# Patient Record
Sex: Female | Born: 1945 | Race: White | Hispanic: No | Marital: Married | Smoking: Never smoker
Health system: Southern US, Community
[De-identification: ages and names within clinical notes are randomized; demographics above are authoritative.]

## PROBLEM LIST (undated history)

## (undated) DIAGNOSIS — T753XXA Motion sickness, initial encounter: Secondary | ICD-10-CM

## (undated) DIAGNOSIS — F329 Major depressive disorder, single episode, unspecified: Secondary | ICD-10-CM

## (undated) DIAGNOSIS — Z9889 Other specified postprocedural states: Secondary | ICD-10-CM

## (undated) DIAGNOSIS — F32A Depression, unspecified: Secondary | ICD-10-CM

## (undated) DIAGNOSIS — R112 Nausea with vomiting, unspecified: Secondary | ICD-10-CM

## (undated) DIAGNOSIS — C801 Malignant (primary) neoplasm, unspecified: Secondary | ICD-10-CM

## (undated) HISTORY — PX: BREAST BIOPSY: SHX20

## (undated) HISTORY — PX: BREAST EXCISIONAL BIOPSY: SUR124

## (undated) HISTORY — PX: NO PAST SURGERIES: SHX2092

---

## 2005-08-21 ENCOUNTER — Ambulatory Visit: Payer: Self-pay | Admitting: Family Medicine

## 2006-10-17 ENCOUNTER — Ambulatory Visit: Payer: Self-pay | Admitting: Family Medicine

## 2008-02-26 ENCOUNTER — Ambulatory Visit: Payer: Self-pay | Admitting: Family Medicine

## 2009-03-09 ENCOUNTER — Ambulatory Visit: Payer: Self-pay | Admitting: Family Medicine

## 2009-03-10 ENCOUNTER — Ambulatory Visit: Payer: Self-pay | Admitting: Family Medicine

## 2011-10-27 ENCOUNTER — Ambulatory Visit: Payer: Self-pay | Admitting: Internal Medicine

## 2013-05-13 ENCOUNTER — Ambulatory Visit: Payer: Self-pay | Admitting: Family Medicine

## 2013-05-21 ENCOUNTER — Ambulatory Visit: Payer: Self-pay | Admitting: Family Medicine

## 2013-09-27 ENCOUNTER — Ambulatory Visit: Payer: Self-pay | Admitting: Physician Assistant

## 2014-01-07 ENCOUNTER — Ambulatory Visit: Payer: Self-pay | Admitting: Emergency Medicine

## 2014-01-07 LAB — URINALYSIS, COMPLETE
Bacteria: NEGATIVE
Bilirubin,UR: NEGATIVE
Glucose,UR: NEGATIVE mg/dL (ref 0–75)
Ketone: NEGATIVE
NITRITE: NEGATIVE
Ph: 8 (ref 4.5–8.0)
Protein: NEGATIVE
Specific Gravity: 1.01 (ref 1.003–1.030)

## 2014-01-07 LAB — CBC WITH DIFFERENTIAL/PLATELET
BASOS PCT: 0.7 %
Basophil #: 0 10*3/uL (ref 0.0–0.1)
EOS ABS: 1 10*3/uL — AB (ref 0.0–0.7)
Eosinophil %: 17 %
HCT: 40.8 % (ref 35.0–47.0)
HGB: 13.7 g/dL (ref 12.0–16.0)
Lymphocyte #: 1.4 10*3/uL (ref 1.0–3.6)
Lymphocyte %: 22.1 %
MCH: 31.4 pg (ref 26.0–34.0)
MCHC: 33.4 g/dL (ref 32.0–36.0)
MCV: 94 fL (ref 80–100)
MONO ABS: 0.4 x10 3/mm (ref 0.2–0.9)
MONOS PCT: 6.3 %
NEUTROS PCT: 53.9 %
Neutrophil #: 3.3 10*3/uL (ref 1.4–6.5)
Platelet: 213 10*3/uL (ref 150–440)
RBC: 4.35 10*6/uL (ref 3.80–5.20)
RDW: 12.9 % (ref 11.5–14.5)
WBC: 6.2 10*3/uL (ref 3.6–11.0)

## 2014-01-07 LAB — AMYLASE: Amylase: 73 U/L (ref 25–115)

## 2014-01-07 LAB — COMPREHENSIVE METABOLIC PANEL
ALK PHOS: 77 U/L
ANION GAP: 11 (ref 7–16)
AST: 28 U/L (ref 15–37)
Albumin: 3.7 g/dL (ref 3.4–5.0)
BUN: 19 mg/dL — ABNORMAL HIGH (ref 7–18)
Bilirubin,Total: 0.6 mg/dL (ref 0.2–1.0)
CO2: 25 mmol/L (ref 21–32)
Calcium, Total: 9.6 mg/dL (ref 8.5–10.1)
Chloride: 105 mmol/L (ref 98–107)
Creatinine: 1.12 mg/dL (ref 0.60–1.30)
GFR CALC AF AMER: 58 — AB
GFR CALC NON AF AMER: 50 — AB
Glucose: 94 mg/dL (ref 65–99)
Osmolality: 283 (ref 275–301)
POTASSIUM: 4.1 mmol/L (ref 3.5–5.1)
SGPT (ALT): 23 U/L (ref 12–78)
SODIUM: 141 mmol/L (ref 136–145)
Total Protein: 7.4 g/dL (ref 6.4–8.2)

## 2014-01-07 LAB — LIPASE, BLOOD: Lipase: 134 U/L (ref 73–393)

## 2014-01-08 ENCOUNTER — Ambulatory Visit: Payer: Self-pay | Admitting: Emergency Medicine

## 2014-01-09 LAB — URINE CULTURE

## 2015-05-10 ENCOUNTER — Other Ambulatory Visit: Payer: Self-pay | Admitting: Family Medicine

## 2015-05-10 DIAGNOSIS — Z1231 Encounter for screening mammogram for malignant neoplasm of breast: Secondary | ICD-10-CM

## 2015-11-11 ENCOUNTER — Encounter: Payer: Self-pay | Admitting: Emergency Medicine

## 2015-11-11 ENCOUNTER — Ambulatory Visit
Admission: EM | Admit: 2015-11-11 | Discharge: 2015-11-11 | Disposition: A | Discharge status: Home / Self Care | Payer: Medicare Other | Attending: Family Medicine | Admitting: Family Medicine

## 2015-11-11 DIAGNOSIS — M79644 Pain in right finger(s): Secondary | ICD-10-CM | POA: Diagnosis not present

## 2015-11-11 DIAGNOSIS — L03011 Cellulitis of right finger: Secondary | ICD-10-CM

## 2015-11-11 HISTORY — DX: Major depressive disorder, single episode, unspecified: F32.9

## 2015-11-11 HISTORY — DX: Depression, unspecified: F32.A

## 2015-11-11 MED ORDER — MUPIROCIN 2 % EX OINT: 1.0000 "application " | TOPICAL_OINTMENT | Freq: Three times a day (TID) | CUTANEOUS | Status: DC

## 2015-11-11 MED ORDER — SULFAMETHOXAZOLE-TRIMETHOPRIM 800-160 MG PO TABS: 1.0000 | ORAL_TABLET | Freq: Two times a day (BID) | ORAL | Status: DC

## 2015-11-11 NOTE — Discharge Instructions (Signed)
Fingertip Infection When an infection is around the nail, it is called a paronychia. When it appears over the tip of the finger, it is called a felon. These infections are due to minor injuries or cracks in the skin. If they are not treated properly, they can lead to bone infection and permanent damage to the fingernail. Incision and drainage is necessary if a pus pocket (an abscess) has formed. Antibiotics and pain medicine may also be needed. Keep your hand elevated for the next 2-3 days to reduce swelling and pain. If a pack was placed in the abscess, it should be removed in 1-2 days by your caregiver. Soak the finger in warm water for 20 minutes 4 times daily to help promote drainage. Keep the hands as dry as possible. Wear protective gloves with cotton liners. See your caregiver for follow-up care as recommended.  HOME CARE INSTRUCTIONS   Keep wound clean, dry and dressed as suggested by your caregiver.  Soak in warm salt water for fifteen minutes, four times per day for bacterial infections.  Your caregiver will prescribe an antibiotic if a bacterial infection is suspected. Take antibiotics as directed and finish the prescription, even if the problem appears to be improving before the medicine is gone.  Only take over-the-counter or prescription medicines for pain, discomfort, or fever as directed by your caregiver. SEEK IMMEDIATE MEDICAL CARE IF:  There is redness, swelling, or increasing pain in the wound.  Pus or any other unusual drainage is coming from the wound.  An unexplained oral temperature above 102 F (38.9 C) develops.  You notice a foul smell coming from the wound or dressing. MAKE SURE YOU:   Understand these instructions.  Monitor your condition.  Contact your caregiver if you are getting worse or not improving.   This information is not intended to replace advice given to you by your health care provider. Make sure you discuss any questions you have with your  health care provider.   Document Released: 07/26/2004 Document Revised: 09/10/2011 Document Reviewed: 12/06/2014 Elsevier Interactive Patient Education 2016 Elsevier Inc.  Cellulitis Cellulitis is an infection of the skin and the tissue under the skin. The infected area is usually red and tender. This happens most often in the arms and lower legs. HOME CARE   Take your antibiotic medicine as told. Finish the medicine even if you start to feel better.  Keep the infected arm or leg raised (elevated).  Put a warm cloth on the area up to 4 times per day.  Only take medicines as told by your doctor.  Keep all doctor visits as told. GET HELP IF:  You see red streaks on the skin coming from the infected area.  Your red area gets bigger or turns a dark color.  Your bone or joint under the infected area is painful after the skin heals.  Your infection comes back in the same area or different area.  You have a puffy (swollen) bump in the infected area.  You have new symptoms.  You have a fever. GET HELP RIGHT AWAY IF:   You feel very sleepy.  You throw up (vomit) or have watery poop (diarrhea).  You feel sick and have muscle aches and pains.   This information is not intended to replace advice given to you by your health care provider. Make sure you discuss any questions you have with your health care provider.   Document Released: 12/05/2007 Document Revised: 03/09/2015 Document Reviewed: 09/03/2011 Elsevier Interactive Patient  Education ©2016 Elsevier Inc. ° °

## 2015-11-11 NOTE — ED Provider Notes (Signed)
CSN: [id number]     Arrival date & time 11/11/15  1616 History   First MD Initiated Contact with Patient 11/11/15 1723    Nurses notes were reviewed. Chief Complaint  Patient presents with  . Thumb Pain    Patient is complaining of pain in right thumb. States that she woke this morning with the palmar surface of the distal right thumb tender to touch to palpation. She states she uses her hands a lot and last menstrual period or irritated. She does have had an infection or trouble in her fingers like this before. Everything started today.  (Consider location/radiation/quality/duration/timing/severity/associated sxs/prior Treatment) Patient is a 70 y.o. female presenting with abscess. The history is provided by the patient. No language interpreter was used.  Abscess Location:  Finger Finger abscess location:  R thumb Abscess quality: induration and redness   Chronicity:  New Relieved by:  Nothing Ineffective treatments:  None tried Risk factors: no family hx of MRSA and no hx of MRSA     Past Medical History  Diagnosis Date  . Depression    Past Surgical History  Procedure Laterality Date  . No past surgeries     History reviewed. No pertinent family history. Social History  Substance Use Topics  . Smoking status: Never Smoker   . Smokeless tobacco: None  . Alcohol Use: No   OB History    No data available     Review of Systems  All other systems reviewed and are negative.   Allergies  Review of patient's allergies indicates no known allergies.  Home Medications   Prior to Admission medications   Medication Sig Start Date End Date Taking? Authorizing Provider  buPROPion (WELLBUTRIN) 100 MG tablet Take 100 mg by mouth 2 (two) times daily.   Yes Historical Provider, MD  sertraline (ZOLOFT) 50 MG tablet Take 50 mg by mouth daily.   Yes Historical Provider, MD  mupirocin ointment (BACTROBAN) 2 % Apply 1 application topically 3 (three) times daily. 11/11/15   Frederich Cha,  MD  sulfamethoxazole-trimethoprim (BACTRIM DS,SEPTRA DS) 800-160 MG tablet Take 1 tablet by mouth 2 (two) times daily. 11/11/15   Frederich Cha, MD   Meds Ordered and Administered this Visit  Medications - No data to display  BP 115/50 mmHg  Pulse 53  Temp(Src) 97.4 F (36.3 C)  Resp 16  Ht 5\' 10"  (1.778 m)  Wt 165 lb (74.844 kg)  BMI 23.68 kg/m2  SpO2 98% No data found.   Physical Exam  Constitutional: She is oriented to person, place, and time. She appears well-developed and well-nourished.  HENT:  Head: Normocephalic.  Eyes: Pupils are equal, round, and reactive to light.  Musculoskeletal: Normal range of motion.       Right hand: She exhibits tenderness.       Hands: Redness present over the distal  palmar surface of the right thumb  Neurological: She is alert and oriented to person, place, and time.  Skin: Skin is warm. No rash noted.  Psychiatric: She has a normal mood and affect. Her behavior is normal.  Vitals reviewed.   ED Course  Procedures (including critical care time)  Labs Review Labs Reviewed - No data to display  Imaging Review No results found.   Visual Acuity Review  Right Eye Distance:   Left Eye Distance:   Bilateral Distance:    Right Eye Near:   Left Eye Near:    Bilateral Near:         MDM  1. Thumb pain, right   2. Cellulitis of thumb, right    Explained patient that if this gets worse she may need to see a hand surgeon to have this open and she does she needs to go to [facility] or [facility] for Copy to be called. Recommend soaking Epson salt. Since we think is an infection present we'll place on Septra DS 1 tablet twice a day back pain ointment 3 times a day. Hopefully this is an early infection the right thumb that were treating appropriately and is going to get better.   Note: This dictation was prepared with Dragon dictation along with smaller phrase technology. Any transcriptional errors that result from this  process are unintentional.   Frederich Cha, MD 11/11/15 1810

## 2015-11-11 NOTE — ED Notes (Signed)
Right thumb pain started today.

## 2016-05-10 ENCOUNTER — Other Ambulatory Visit: Payer: Self-pay | Admitting: Family Medicine

## 2016-05-10 DIAGNOSIS — Z1239 Encounter for other screening for malignant neoplasm of breast: Secondary | ICD-10-CM

## 2016-07-10 ENCOUNTER — Ambulatory Visit
Admission: RE | Admit: 2016-07-10 | Discharge: 2016-07-10 | Disposition: A | Discharge status: Home / Self Care | Payer: Medicare Other | Source: Ambulatory Visit | Attending: Family Medicine | Admitting: Family Medicine

## 2016-07-10 DIAGNOSIS — Z1231 Encounter for screening mammogram for malignant neoplasm of breast: Secondary | ICD-10-CM | POA: Diagnosis present

## 2016-07-10 DIAGNOSIS — Z1239 Encounter for other screening for malignant neoplasm of breast: Secondary | ICD-10-CM

## 2016-07-10 HISTORY — DX: Malignant (primary) neoplasm, unspecified: C80.1

## 2017-05-14 ENCOUNTER — Other Ambulatory Visit: Payer: Self-pay | Admitting: Family Medicine

## 2017-05-14 DIAGNOSIS — Z1231 Encounter for screening mammogram for malignant neoplasm of breast: Secondary | ICD-10-CM

## 2017-06-24 ENCOUNTER — Other Ambulatory Visit
Admission: RE | Admit: 2017-06-24 | Discharge: 2017-06-24 | Disposition: A | Discharge status: Home / Self Care | Payer: Medicare Other | Source: Ambulatory Visit | Attending: Internal Medicine | Admitting: Internal Medicine

## 2017-06-24 DIAGNOSIS — R197 Diarrhea, unspecified: Secondary | ICD-10-CM | POA: Diagnosis present

## 2017-06-24 LAB — LACTOFERRIN, FECAL, QUALITATIVE: LACTOFERRIN, FECAL, QUAL: NEGATIVE

## 2017-07-04 LAB — GIARDIA, EIA; OVA/PARASITE: Giardia Ag, Stl: NEGATIVE

## 2017-07-04 LAB — O&P RESULT

## 2019-05-11 ENCOUNTER — Other Ambulatory Visit: Payer: Self-pay | Admitting: Gerontology

## 2019-05-11 DIAGNOSIS — Z1231 Encounter for screening mammogram for malignant neoplasm of breast: Secondary | ICD-10-CM

## 2019-05-11 DIAGNOSIS — Z1239 Encounter for other screening for malignant neoplasm of breast: Secondary | ICD-10-CM

## 2019-05-11 DIAGNOSIS — Z0000 Encounter for general adult medical examination without abnormal findings: Secondary | ICD-10-CM

## 2019-05-11 DIAGNOSIS — Z7189 Other specified counseling: Secondary | ICD-10-CM

## 2019-05-12 DIAGNOSIS — R7989 Other specified abnormal findings of blood chemistry: Secondary | ICD-10-CM | POA: Insufficient documentation

## 2019-11-09 DIAGNOSIS — G250 Essential tremor: Secondary | ICD-10-CM | POA: Insufficient documentation

## 2020-05-16 ENCOUNTER — Other Ambulatory Visit: Payer: Self-pay | Admitting: Gerontology

## 2020-05-16 DIAGNOSIS — Z1231 Encounter for screening mammogram for malignant neoplasm of breast: Secondary | ICD-10-CM

## 2020-11-14 ENCOUNTER — Other Ambulatory Visit: Payer: Self-pay | Admitting: Gerontology

## 2020-11-14 DIAGNOSIS — Z1231 Encounter for screening mammogram for malignant neoplasm of breast: Secondary | ICD-10-CM

## 2021-03-10 ENCOUNTER — Other Ambulatory Visit: Payer: Self-pay

## 2021-03-10 ENCOUNTER — Emergency Department: Payer: Medicare Other

## 2021-03-10 ENCOUNTER — Encounter: Payer: Self-pay | Admitting: Emergency Medicine

## 2021-03-10 ENCOUNTER — Emergency Department
Admission: EM | Admit: 2021-03-10 | Discharge: 2021-03-10 | Disposition: A | Discharge status: Home / Self Care | Payer: Medicare Other | Attending: Emergency Medicine | Admitting: Emergency Medicine

## 2021-03-10 DIAGNOSIS — R0789 Other chest pain: Secondary | ICD-10-CM | POA: Diagnosis present

## 2021-03-10 DIAGNOSIS — Z85828 Personal history of other malignant neoplasm of skin: Secondary | ICD-10-CM | POA: Diagnosis not present

## 2021-03-10 DIAGNOSIS — R0602 Shortness of breath: Secondary | ICD-10-CM | POA: Diagnosis not present

## 2021-03-10 LAB — TROPONIN I (HIGH SENSITIVITY): Troponin I (High Sensitivity): 4 ng/L (ref <18)

## 2021-03-10 LAB — BASIC METABOLIC PANEL
Anion gap: 6 (ref 5–15)
BUN: 28 mg/dL — ABNORMAL HIGH (ref 8–23)
CO2: 29 mmol/L (ref 22–32)
Calcium: 9.7 mg/dL (ref 8.9–10.3)
Chloride: 100 mmol/L (ref 98–111)
Creatinine, Ser: 0.99 mg/dL (ref 0.44–1.00)
GFR, Estimated: 59 mL/min — ABNORMAL LOW (ref >60)
Glucose, Bld: 98 mg/dL (ref 70–99)
Potassium: 4 mmol/L (ref 3.5–5.1)
Sodium: 135 mmol/L (ref 135–145)

## 2021-03-10 LAB — CBC
HCT: 38.1 % (ref 36.0–46.0)
Hemoglobin: 13.1 g/dL (ref 12.0–15.0)
MCH: 33.6 pg (ref 26.0–34.0)
MCHC: 34.4 g/dL (ref 30.0–36.0)
MCV: 97.7 fL (ref 80.0–100.0)
Platelets: 229 10*3/uL (ref 150–400)
RBC: 3.9 MIL/uL (ref 3.87–5.11)
RDW: 13 % (ref 11.5–15.5)
WBC: 6.3 10*3/uL (ref 4.0–10.5)
nRBC: 0 % (ref 0.0–0.2)

## 2021-03-10 NOTE — ED Triage Notes (Signed)
Pt sent over from [facility]. Pt reports awoke this am with cp to her mid chest radiating under her right breast that is intermittent and sharp and achy in nature. Pt reports broke her sternum over labor day but that has healed however the pain feels the same.

## 2021-03-10 NOTE — ED Provider Notes (Signed)
[facility] Emergency Department Provider Note   ____________________________________________   Event Date/Time   First MD Initiated Contact with Patient 03/10/21 1622     (approximate)  I have reviewed the triage vital signs and the nursing notes.   HISTORY  Chief Complaint Chest Pain and Shortness of Breath    HPI Teresa Hogan is a 75 y.o. female who presents for anterior chest wall pain  LOCATION: Substernal and left chest wall DURATION: Began this morning TIMING: Improved since onset and intermittent SEVERITY: 5/10 QUALITY: Sharp, aching CONTEXT: Patient states that when she awoke this morning she began having intermittent sharp left-sided and substernal chest pain that she states is similar to when she broke her sternum approximately 4 months ago MODIFYING FACTORS: Denies any modifying factors ASSOCIATED SYMPTOMS: Right arm discomfort, mild shortness of breath   Per medical record review, patient has history of sternal fracture          Past Medical History:  Diagnosis Date   Cancer ([place])    skin ca   Depression     There are no problems to display for this patient.   Past Surgical History:  Procedure Laterality Date   BREAST BIOPSY Left    neg   NO PAST SURGERIES      Prior to Admission medications   Medication Sig Start Date End Date Taking? Authorizing Provider  buPROPion (WELLBUTRIN) 100 MG tablet Take 100 mg by mouth 2 (two) times daily.    [provider]  mupirocin ointment (BACTROBAN) 2 % Apply 1 application topically 3 (three) times daily. 11/11/15   Frederich Cha, MD  sertraline (ZOLOFT) 50 MG tablet Take 50 mg by mouth daily.    [provider]  sulfamethoxazole-trimethoprim (BACTRIM DS,SEPTRA DS) 800-160 MG tablet Take 1 tablet by mouth 2 (two) times daily. 11/11/15   Frederich Cha, MD    Allergies Patient has no known allergies.  Family History  Problem Relation Age of Onset   Breast  cancer Mother 40   Breast cancer Maternal Aunt 31   Breast cancer Maternal Grandmother        mat gm and mat great gm    Social History Social History   Tobacco Use   Smoking status: Never  Substance Use Topics   Alcohol use: No   Drug use: No    Review of Systems Constitutional: No fever/chills Eyes: No visual changes. ENT: No sore throat. Cardiovascular: Endorses chest pain. Respiratory: Endorses mild shortness of breath. Gastrointestinal: No abdominal pain.  No nausea, no vomiting.  No diarrhea. Genitourinary: Negative for dysuria. Musculoskeletal: Negative for acute arthralgias Skin: Negative for rash. Neurological: Negative for headaches, weakness/numbness/paresthesias in any extremity Psychiatric: Negative for suicidal ideation/homicidal ideation   ____________________________________________   PHYSICAL EXAM:  VITAL SIGNS: ED Triage Vitals  Enc Vitals Group     BP 03/10/21 1514 136/61     Pulse Rate 03/10/21 1514 99     Resp 03/10/21 1514 16     Temp 03/10/21 1514 98.7 F (37.1 C)     Temp Source 03/10/21 1514 Oral     SpO2 03/10/21 1514 99 %     Weight 03/10/21 1511 160 lb (72.6 kg)     Height 03/10/21 1511 '5\' 10"'$  (1.778 m)     Head Circumference --      Peak Flow --      Pain Score 03/10/21 1511 5     Pain Loc --      Pain  Edu? --      Excl. in [place]? --    Constitutional: Alert and oriented. Well appearing and in no acute distress. Eyes: Conjunctivae are normal. PERRL. Head: Atraumatic. Nose: No congestion/rhinnorhea. Mouth/Throat: Mucous membranes are moist. Neck: No stridor Cardiovascular: Grossly normal heart sounds.  Good peripheral circulation. Respiratory: Normal respiratory effort.  No retractions. Gastrointestinal: Soft and nontender. No distention. Musculoskeletal: Left chest and substernal tenderness to palpation.  No obvious deformities Neurologic:  Normal speech and language. No gross focal neurologic deficits are appreciated. Skin:   Skin is warm and dry. No rash noted. Psychiatric: Mood and affect are normal. Speech and behavior are normal.  ____________________________________________   LABS (all labs ordered are listed, but only abnormal results are displayed)  Labs Reviewed  BASIC METABOLIC PANEL - Abnormal; Notable for the following components:      Result Value   BUN 28 (*)    GFR, Estimated 59 (*)    All other components within normal limits  CBC  TROPONIN I (HIGH SENSITIVITY)  TROPONIN I (HIGH SENSITIVITY)   ____________________________________________  EKG  ED ECG REPORT I, Naaman Plummer, the attending physician, personally viewed and interpreted this ECG.  Date: 03/10/2021 EKG Time: 1503 Rate: 78 Rhythm: normal sinus rhythm QRS Axis: normal Intervals: normal ST/T Wave abnormalities: normal Narrative Interpretation: no evidence of acute ischemia  ____________________________________________  RADIOLOGY  ED MD interpretation: 2 view chest x-ray shows no evidence of acute abnormalities including no pneumonia, pneumothorax, or widened mediastinum  Official radiology report(s): DG Chest 2 View  Result Date: 03/10/2021 CLINICAL DATA:  Chest pain, history of recent sternal fracture EXAM: CHEST - 2 VIEW COMPARISON:  None. FINDINGS: The heart size and mediastinal contours are within normal limits. Both lungs are clear. The visualized skeletal structures are unremarkable. IMPRESSION: 1.  No acute abnormality of the lungs. 2. No obvious sternal fracture on chest radiographs, which are not tailored for evaluation of the sternum. Electronically Signed   By: Eddie Candle M.D.   On: 03/10/2021 15:54    ____________________________________________   PROCEDURES  Procedure(s) performed (including Critical Care):  .1-3 Lead EKG Interpretation Performed by: Naaman Plummer, MD Authorized by: Naaman Plummer, MD     Interpretation: normal     ECG rate:  98   ECG rate assessment: normal     Rhythm:  sinus rhythm     Ectopy: none     Conduction: normal     ____________________________________________   INITIAL IMPRESSION / ASSESSMENT AND PLAN / ED COURSE  As part of my medical decision making, I reviewed the following data within the electronic medical record, if available:  Nursing notes reviewed and incorporated, Labs reviewed, EKG interpreted, Old chart reviewed, Radiograph reviewed and Notes from prior ED visits reviewed and incorporated        Workup: ECG, CXR, CBC, BMP, Troponin Findings: ECG: No overt evidence of STEMI. No evidence of Brugadas sign, delta wave, epsilon wave, significantly prolonged QTc, or malignant arrhythmia HS Troponin: Negative x1 Other Labs unremarkable for emergent problems. CXR: Without PTX, PNA, or widened mediastinum Last Stress Test: Never Last Heart Catheterization: Never HEART Score: 2  Given History, Exam, and Workup I have low suspicion for ACS, Pneumothorax, Pneumonia, Pulmonary Embolus, Tamponade, Aortic Dissection or other emergent problem as a cause for this presentation.   Reassesment: Prior to discharge patients pain was controlled and they were well appearing.  Disposition:  Discharge. Strict return precautions discussed with patient with full understanding. Advised patient  to follow up promptly with primary care provider      ____________________________________________   FINAL CLINICAL IMPRESSION(S) / ED DIAGNOSES  Final diagnoses:  Chest wall pain     ED Discharge Orders     None        Note:  This document was prepared using Dragon voice recognition software and may include unintentional dictation errors.    Naaman Plummer, MD 03/10/21 [phone]

## 2021-08-29 ENCOUNTER — Telehealth: Payer: Self-pay | Admitting: Family Medicine

## 2021-08-29 NOTE — Telephone Encounter (Signed)
Santiago Glad can you schedule pt for a new patient appt.

## 2021-08-29 NOTE — Telephone Encounter (Signed)
Pt called stating she wanted to make a new patient appointment. Pt stated that a pt of Tullo Eula Listen) told her that Derrel Nip said to call the office to make appointment.

## 2021-08-29 NOTE — Telephone Encounter (Signed)
Are you aware of this 

## 2021-08-29 NOTE — Telephone Encounter (Signed)
Yes I did agree to see her

## 2021-09-15 ENCOUNTER — Encounter: Payer: Self-pay | Admitting: Internal Medicine

## 2021-09-15 ENCOUNTER — Ambulatory Visit (INDEPENDENT_AMBULATORY_CARE_PROVIDER_SITE_OTHER): Payer: Medicare Other | Admitting: Internal Medicine

## 2021-09-15 ENCOUNTER — Other Ambulatory Visit: Payer: Self-pay

## 2021-09-15 VITALS — BP 112/66 | HR 83 | Temp 98.2°F | Ht 70.0 in | Wt 154.2 lb

## 2021-09-15 DIAGNOSIS — F331 Major depressive disorder, recurrent, moderate: Secondary | ICD-10-CM | POA: Diagnosis not present

## 2021-09-15 DIAGNOSIS — K529 Noninfective gastroenteritis and colitis, unspecified: Secondary | ICD-10-CM

## 2021-09-15 DIAGNOSIS — M85851 Other specified disorders of bone density and structure, right thigh: Secondary | ICD-10-CM

## 2021-09-15 DIAGNOSIS — J302 Other seasonal allergic rhinitis: Secondary | ICD-10-CM | POA: Diagnosis not present

## 2021-09-15 DIAGNOSIS — N1831 Chronic kidney disease, stage 3a: Secondary | ICD-10-CM | POA: Diagnosis not present

## 2021-09-15 DIAGNOSIS — Z1231 Encounter for screening mammogram for malignant neoplasm of breast: Secondary | ICD-10-CM

## 2021-09-15 DIAGNOSIS — K760 Fatty (change of) liver, not elsewhere classified: Secondary | ICD-10-CM
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
## 2021-09-15 NOTE — Patient Instructions (Signed)
Welcome! ? ?Your labs will be resulted over the weekend and I will send comments  ? ?If the diarrhea does not resolve, let me know ? ?Your annual mammogram AND your 5 fear follow up   DEXA  SCAN have been ordered.  Please call [place] to call to make your appointments  .  The phone number for [place] [facility] is  [phone]   ?

## 2021-09-16 LAB — CBC WITH DIFFERENTIAL/PLATELET
Absolute Monocytes: 496 cells/uL (ref 200–950)
Basophils Absolute: 62 cells/uL (ref 0–200)
Basophils Relative: 1 %
Eosinophils Absolute: 428 cells/uL (ref 15–500)
Eosinophils Relative: 6.9 %
HCT: 40.3 % (ref 35.0–45.0)
Hemoglobin: 13.9 g/dL (ref 11.7–15.5)
Lymphs Abs: 1414 cells/uL (ref 850–3900)
MCH: 32.7 pg (ref 27.0–33.0)
MCHC: 34.5 g/dL (ref 32.0–36.0)
MCV: 94.8 fL (ref 80.0–100.0)
MPV: 9.7 fL (ref 7.5–12.5)
Monocytes Relative: 8 %
Neutro Abs: 3801 cells/uL (ref 1500–7800)
Neutrophils Relative %: 61.3 %
Platelets: 239 10*3/uL (ref 140–400)
RBC: 4.25 10*6/uL (ref 3.80–5.10)
RDW: 12.7 % (ref 11.0–15.0)
Total Lymphocyte: 22.8 %
WBC: 6.2 10*3/uL (ref 3.8–10.8)

## 2021-09-16 LAB — COMPLETE METABOLIC PANEL WITH GFR
AG Ratio: 1.6 (calc) (ref 1.0–2.5)
ALT: 13 U/L (ref 6–29)
AST: 18 U/L (ref 10–35)
Albumin: 4.2 g/dL (ref 3.6–5.1)
Alkaline phosphatase (APISO): 84 U/L (ref 37–153)
BUN/Creatinine Ratio: 26 (calc) — ABNORMAL HIGH (ref 6–22)
BUN: 27 mg/dL — ABNORMAL HIGH (ref 7–25)
CO2: 30 mmol/L (ref 20–32)
Calcium: 10.3 mg/dL (ref 8.6–10.4)
Chloride: 105 mmol/L (ref 98–110)
Creat: 1.05 mg/dL — ABNORMAL HIGH (ref 0.60–1.00)
Globulin: 2.6 g/dL (calc) (ref 1.9–3.7)
Glucose, Bld: 83 mg/dL (ref 65–99)
Potassium: 4.3 mmol/L (ref 3.5–5.3)
Sodium: 140 mmol/L (ref 135–146)
Total Bilirubin: 0.4 mg/dL (ref 0.2–1.2)
Total Protein: 6.8 g/dL (ref 6.1–8.1)
eGFR: 55 mL/min/{1.73_m2} — ABNORMAL LOW (ref >=60)

## 2021-09-16 LAB — SEDIMENTATION RATE: Sed Rate: 22 mm/h (ref 0–30)

## 2021-09-16 LAB — LIPASE: Lipase: 16 U/L (ref 7–60)

## 2021-09-16 LAB — VITAMIN B12: Vitamin B-12: 442 pg/mL (ref 200–1100)

## 2021-09-16 LAB — C-REACTIVE PROTEIN: CRP: 3.1 mg/L (ref <8.0)

## 2021-09-17 ENCOUNTER — Encounter: Payer: Self-pay | Admitting: Internal Medicine

## 2021-09-17 DIAGNOSIS — N183 Chronic kidney disease, stage 3 unspecified: Secondary | ICD-10-CM | POA: Insufficient documentation

## 2021-09-17 DIAGNOSIS — K760 Fatty (change of) liver, not elsewhere classified: Secondary | ICD-10-CM | POA: Insufficient documentation

## 2021-09-17 DIAGNOSIS — M8580 Other specified disorders of bone density and structure, unspecified site: Secondary | ICD-10-CM | POA: Insufficient documentation

## 2021-09-17 DIAGNOSIS — K529 Noninfective gastroenteritis and colitis, unspecified: Secondary | ICD-10-CM | POA: Insufficient documentation

## 2021-09-17 DIAGNOSIS — R197 Diarrhea, unspecified: Secondary | ICD-10-CM | POA: Insufficient documentation

## 2021-09-17 DIAGNOSIS — F339 Major depressive disorder, recurrent, unspecified: Secondary | ICD-10-CM | POA: Insufficient documentation

## 2021-09-17 DIAGNOSIS — J302 Other seasonal allergic rhinitis: Secondary | ICD-10-CM | POA: Insufficient documentation

## 2021-09-17 NOTE — Assessment & Plan Note (Signed)
By 2018 DEXA ,  T score -1.3  History of traumatic sternal fracture.  ?

## 2021-09-17 NOTE — Assessment & Plan Note (Signed)
Suggested by current labs and review of past labs.  Will discuss with patient .   ?

## 2021-09-17 NOTE — Assessment & Plan Note (Signed)
Currently managed with zoloft , wellbutrin and seroquel by psychiatry  ?

## 2021-09-17 NOTE — Assessment & Plan Note (Addendum)
Recurrent, suggestive of IBS based on history,  Currently normal inflammatory markers  Agree with continuing probioitcs indefinitely . ? ?Lab Results  ?Component Value Date  ? ESRSEDRATE 22 09/15/2021  ? ?Lab Results  ?Component Value Date  ? CRP 3.1 09/15/2021  ? ? ?

## 2021-09-17 NOTE — Assessment & Plan Note (Addendum)
Suggested by 2015 CT abd and pelvis and MRI done to rule out pancreatic mass  ?

## 2021-09-19 ENCOUNTER — Other Ambulatory Visit: Payer: Self-pay | Admitting: Internal Medicine

## 2021-09-19 DIAGNOSIS — N1831 Chronic kidney disease, stage 3a: Secondary | ICD-10-CM

## 2021-09-19 DIAGNOSIS — R197 Diarrhea, unspecified: Secondary | ICD-10-CM

## 2021-09-20 ENCOUNTER — Telehealth: Payer: Self-pay

## 2021-09-20 NOTE — Telephone Encounter (Signed)
-----   Message from Crecencio Mc, MD sent at 09/19/2021 11:49 AM EDT ----- ?She is referring to her diarrhea .  Please set her up a lab appt to work up the diarrhea.  She will be providing stool samples which I am orderign now  ? ?Nephrology referral In process ?

## 2021-09-20 NOTE — Telephone Encounter (Signed)
LMTCB

## 2021-09-21 ENCOUNTER — Telehealth: Payer: Self-pay | Admitting: Family Medicine

## 2021-10-02 ENCOUNTER — Other Ambulatory Visit (INDEPENDENT_AMBULATORY_CARE_PROVIDER_SITE_OTHER): Payer: Medicare Other

## 2021-10-02 DIAGNOSIS — R197 Diarrhea, unspecified: Secondary | ICD-10-CM | POA: Diagnosis not present

## 2021-10-02 NOTE — Addendum Note (Signed)
Addended by: Leeanne Rio on: 10/02/2021 03:45 PM ? ? Modules accepted: Orders ? ?

## 2021-10-03 LAB — CELIAC DISEASE COMPREHENSIVE PANEL WITH REFLEXES
(tTG) Ab, IgA: <1 U/mL
Immunoglobulin A: 304 mg/dL (ref 70–320)

## 2021-10-06 LAB — H. PYLORI ANTIGEN, STOOL: H pylori Ag, Stl: NEGATIVE

## 2021-10-10 LAB — PANCREATIC ELASTASE, FECAL: Pancreatic Elastase, Fecal: 466 ug Elast./g (ref >200)

## 2021-10-25 ENCOUNTER — Ambulatory Visit: Payer: Medicare Other | Admitting: Internal Medicine

## 2021-10-26 ENCOUNTER — Encounter: Payer: Self-pay | Admitting: Internal Medicine

## 2021-10-26 ENCOUNTER — Other Ambulatory Visit
Admission: RE | Admit: 2021-10-26 | Discharge: 2021-10-26 | Disposition: A | Discharge status: Home / Self Care | Payer: Medicare Other | Source: Ambulatory Visit | Attending: Internal Medicine | Admitting: Internal Medicine

## 2021-10-26 ENCOUNTER — Ambulatory Visit: Payer: Medicare Other | Admitting: Internal Medicine

## 2021-10-26 VITALS — BP 130/70 | HR 74 | Temp 98.0°F | Ht 70.0 in | Wt 151.8 lb

## 2021-10-26 DIAGNOSIS — K529 Noninfective gastroenteritis and colitis, unspecified: Secondary | ICD-10-CM

## 2021-10-26 DIAGNOSIS — N1831 Chronic kidney disease, stage 3a: Secondary | ICD-10-CM

## 2021-10-26 DIAGNOSIS — R197 Diarrhea, unspecified: Secondary | ICD-10-CM

## 2021-10-26 LAB — GASTROINTESTINAL PANEL BY PCR, STOOL (REPLACES STOOL CULTURE)

## 2021-10-26 LAB — MICROALBUMIN / CREATININE URINE RATIO
Creatinine,U: 197.7 mg/dL
Microalb Creat Ratio: 0.7 mg/g (ref 0.0–30.0)
Microalb, Ur: 1.3 mg/dL (ref 0.0–1.9)

## 2021-10-26 NOTE — Patient Instructions (Signed)
Please return the stool samples to [facility] (NOT TO OUR OFFICE) ? ?Once we have ruled out any sort of infection,  I will prescribe Lomotil for your diarrhea ? ?Referral to [facility] is in process  ?

## 2021-10-26 NOTE — Progress Notes (Addendum)
Subjective:  Patient ID: Teresa Hogan, female    DOB: [birth year]  Age: 76 y.o. MRN: [id number]  CC: The primary encounter diagnosis was Stage 3a chronic kidney disease ([place]). Diagnoses of Colitis and Diarrhea of presumed infectious origin were also pertinent to this visit.     HPI Teresa Hogan presents for follow up on persistent diarrhea  Chief Complaint  Patient presents with   Diarrhea   Persistent diarrhea:  last seen one month ago.  Inflammatory markers normal . Pancreatic elastase and H Pylori antigen were normal  she states that stool consistency improved for several weeks,  but for the past week has become profuse and watery, and has been having increased cramping in the lower abdomen and lower back .  She has lost 3 lbs over the past month,  a total of 9 lbs since September 2022.  CKD:  reviewed the last several years of labs showing stable Cr with GFR 103m/min.  She has developed foamy urine    Outpatient Medications Prior to Visit  Medication Sig Dispense Refill   Acetylcysteine 600 MG CAPS Take by mouth.     ascorbic acid (VITAMIN C) 500 MG tablet Take by mouth.     buPROPion (WELLBUTRIN SR) 200 MG 12 hr tablet Take by mouth.     Calcium Carb-Cholecalciferol 500-10 MG-MCG TABS Take 10 mg by mouth in the morning and at bedtime.     cetirizine (ZYRTEC) 10 MG tablet Take by mouth.     Cholecalciferol 50 MCG (2000 UT) CAPS Take 3 capsules daily for 3 months, then reduce to 1 capsule daily thereafter for Vitamin D Deficiency.     fluticasone (FLONASE) 50 MCG/ACT nasal spray Place into the nose.     glucosamine-chondroitin 500-400 MG tablet Take 1 tablet by mouth 3 (three) times daily.     melatonin 5 MG TABS Take 5 mg by mouth as needed.     Multiple Vitamin (MULTI-VITAMIN) tablet Take 1 tablet by mouth daily.     Quercetin 500 MG CAPS Take 1 capsule by mouth daily.     QUETIAPINE FUMARATE PO Take 6.2 mg by mouth daily at 2 am.     sertraline (ZOLOFT) 50 MG  tablet Take 75 mg by mouth. Take 1 and 1/2 tablet daily to equal 75 mg     No facility-administered medications prior to visit.    Review of Systems;  Patient denies headache, fevers, malaise, unintentional weight loss, skin rash, eye pain, sinus congestion and sinus pain, sore throat, dysphagia,  hemoptysis , cough, dyspnea, wheezing, chest pain, palpitations, orthopnea, edema, abdominal pain, nausea, melena, diarrhea, constipation, flank pain, dysuria, hematuria, urinary  Frequency, nocturia, numbness, tingling, seizures,  Focal weakness, Loss of consciousness,  Tremor, insomnia, depression, anxiety, and suicidal ideation.      Objective:  BP 130/70 (BP Location: Left Arm, Patient Position: Sitting, Cuff Size: Normal)   Pulse 74   Temp 98 F (36.7 C) (Oral)   Ht '5\' 10"'$  (1.778 m)   Wt 151 lb 12.8 oz (68.9 kg)   SpO2 99%   BMI 21.78 kg/m   BP Readings from Last 3 Encounters:  10/26/21 130/70  09/15/21 112/66  03/10/21 (!) 132/57    Wt Readings from Last 3 Encounters:  10/26/21 151 lb 12.8 oz (68.9 kg)  09/15/21 154 lb 3.2 oz (69.9 kg)  03/10/21 160 lb (72.6 kg)    General appearance: alert, cooperative and appears stated age Ears: normal TM's and external ear canals  both ears Throat: lips, mucosa, and tongue normal; teeth and gums normal Neck: no adenopathy, no carotid bruit, supple, symmetrical, trachea midline and thyroid not enlarged, symmetric, no tenderness/mass/nodules Back: symmetric, no curvature. ROM normal. No CVA tenderness. Lungs: clear to auscultation bilaterally Heart: regular rate and rhythm, S1, S2 normal, no murmur, click, rub or gallop Abdomen: soft, non-tender; bowel sounds normal; no masses,  no organomegaly Pulses: 2+ and symmetric Skin: Skin color, texture, turgor normal. No rashes or lesions Lymph nodes: Cervical, supraclavicular, and axillary nodes normal.  No results found for: HGBA1C  Lab Results  Component Value Date   CREATININE 1.05 (H)  09/15/2021   CREATININE 0.99 03/10/2021   CREATININE 1.12 01/07/2014    Lab Results  Component Value Date   WBC 6.2 09/15/2021   HGB 13.9 09/15/2021   HCT 40.3 09/15/2021   PLT 239 09/15/2021   GLUCOSE 83 09/15/2021   ALT 13 09/15/2021   AST 18 09/15/2021   NA 140 09/15/2021   K 4.3 09/15/2021   CL 105 09/15/2021   CREATININE 1.05 (H) 09/15/2021   BUN 27 (H) 09/15/2021   CO2 30 09/15/2021   MICROALBUR 1.3 10/26/2021    DG Chest 2 View  Result Date: 03/10/2021 CLINICAL DATA:  Chest pain, history of recent sternal fracture EXAM: CHEST - 2 VIEW COMPARISON:  None. FINDINGS: The heart size and mediastinal contours are within normal limits. Both lungs are clear. The visualized skeletal structures are unremarkable. IMPRESSION: 1.  No acute abnormality of the lungs. 2. No obvious sternal fracture on chest radiographs, which are not tailored for evaluation of the sternum. Electronically Signed   By: Eddie Candle M.D.   On: 03/10/2021 15:54    Assessment & Plan:   Problem List Items Addressed This Visit     CKD (chronic kidney disease) stage 3, GFR 30-59 ml/min (HCC) - Primary    Appears to be stable over the past several years based on review of labs from [place] [facility].  Referral to nephrology in process,  Screening for nephropathy, MM ordered today        Relevant Orders   Urine Microalbumin w/creat. ratio (Completed)   ANA w/Reflex if Positive (Completed)   Protein electrophoresis, serum (Completed)   Immunofixation interpretive, urine (Completed)   Diarrhea of presumed infectious origin    Last comprehensive workup prior to current was in 2018 with colonoscopy fecal tests for parasites,  Etc,  All of which were normal.  Given her weight loss of 10 lbs since Sept 2022  I am concerned about microscopic colitis vs community acquired c dificile colitis, as well as parasitic and viral/bacterial etiologies  .  Referral to [facility] in process.  Will repeat GI Pathogen panel and ova  and parasites.  Add lomotil once infections are ruled out        Other Visit Diagnoses     Colitis       Relevant Orders   Ambulatory referral to Gastroenterology   GI pathogen panel by PCR, stool   Ova and parasite examination       I spent a total of  34 minutes with this patient in a face to face visit on the date of this encounter reviewing  her prior labs from [place] [facility] and her GI workup in 2018 by [facility]  including colonoscopy, Her  diet and eating habits     last office visit with me in [place]     and post visit ordering of  testing and therapeutics.    Follow-up: No follow-ups on file.   Crecencio Mc, MD

## 2021-10-26 NOTE — Addendum Note (Signed)
Addended by: Neta Ehlers on: 10/26/2021 02:12 PM ? ? Modules accepted: Orders ? ?

## 2021-10-26 NOTE — Assessment & Plan Note (Signed)
Appears to be stable over the past several years based on review of labs from [place] [facility].  Referral to nephrology in process,  Screening for nephropathy, MM ordered today  ?

## 2021-10-26 NOTE — Assessment & Plan Note (Addendum)
Last comprehensive workup prior to current was in 2018 with colonoscopy fecal tests for parasites,  Etc,  All of which were normal.  Given her weight loss of 10 lbs since Sept 2022  I am concerned about microscopic colitis vs community acquired c dificile colitis, as well as parasitic and viral/bacterial etiologies  .  Referral to [facility] in process.  Will repeat GI Pathogen panel and ova and parasites.  Add lomotil once infections are ruled out

## 2021-10-27 ENCOUNTER — Other Ambulatory Visit: Payer: Medicare Other

## 2021-10-27 ENCOUNTER — Other Ambulatory Visit
Admission: RE | Admit: 2021-10-27 | Discharge: 2021-10-27 | Disposition: A | Discharge status: Home / Self Care | Payer: Medicare Other | Source: Ambulatory Visit | Attending: Internal Medicine | Admitting: Internal Medicine

## 2021-10-27 DIAGNOSIS — K529 Noninfective gastroenteritis and colitis, unspecified: Secondary | ICD-10-CM | POA: Diagnosis present

## 2021-10-27 DIAGNOSIS — N1831 Chronic kidney disease, stage 3a: Secondary | ICD-10-CM

## 2021-10-27 LAB — ANA W/REFLEX IF POSITIVE: Anti Nuclear Antibody (ANA): NEGATIVE

## 2021-10-31 LAB — PROTEIN ELECTROPHORESIS, SERUM
Albumin ELP: 4.1 g/dL (ref 3.8–4.8)
Alpha 1: 0.3 g/dL (ref 0.2–0.3)
Alpha 2: 0.7 g/dL (ref 0.5–0.9)
Beta 2: 0.4 g/dL (ref 0.2–0.5)
Beta Globulin: 0.5 g/dL (ref 0.4–0.6)
Gamma Globulin: 0.9 g/dL (ref 0.8–1.7)
Total Protein: 6.9 g/dL (ref 6.1–8.1)

## 2021-10-31 LAB — O&P RESULT

## 2021-10-31 LAB — OVA + PARASITE EXAM

## 2021-11-01 ENCOUNTER — Encounter: Payer: Self-pay | Admitting: Gastroenterology

## 2021-11-01 ENCOUNTER — Ambulatory Visit: Payer: Medicare Other | Admitting: Gastroenterology

## 2021-11-01 NOTE — Progress Notes (Deleted)
Jonathon Bellows MD, MRCP(U.K) [address]  [place]  [place], [place] [address]  Main: [phone]  Fax: [phone]   Gastroenterology Consultation  Referring Provider:     Crecencio Mc, MD Primary Care Physician:  Crecencio Mc, MD Primary Gastroenterologist:  Dr. Jonathon Bellows  Reason for Consultation:     Colitis        HPI:   ARACELIS ULREY is a 76 y.o. y/o female referred for consultation & management  by Dr. Derrel Nip, Aris Everts, MD.  ***  Past Medical History:  Diagnosis Date   Cancer [facility])    skin ca   Depression     Past Surgical History:  Procedure Laterality Date   BREAST BIOPSY Left    neg   NO PAST SURGERIES      Prior to Admission medications   Medication Sig Start Date End Date Taking? Authorizing Provider  Acetylcysteine 600 MG CAPS Take by mouth.    [provider]  ascorbic acid (VITAMIN C) 500 MG tablet Take by mouth.    [provider]  buPROPion (WELLBUTRIN SR) 200 MG 12 hr tablet Take by mouth. 09/02/19   [provider]  Calcium Carb-Cholecalciferol 500-10 MG-MCG TABS Take 10 mg by mouth in the morning and at bedtime.    [provider]  cetirizine (ZYRTEC) 10 MG tablet Take by mouth.    [provider]  Cholecalciferol 50 MCG (2000 UT) CAPS Take 3 capsules daily for 3 months, then reduce to 1 capsule daily thereafter for Vitamin D Deficiency. 05/12/19   [provider]  fluticasone (FLONASE) 50 MCG/ACT nasal spray Place into the nose.    [provider]  glucosamine-chondroitin 500-400 MG tablet Take 1 tablet by mouth 3 (three) times daily.    [provider]  melatonin 5 MG TABS Take 5 mg by mouth as needed.    [provider]  Multiple Vitamin (MULTI-VITAMIN) tablet Take 1 tablet by mouth daily.    [provider]  Quercetin 500 MG CAPS Take 1 capsule by mouth daily.    [provider]  QUETIAPINE FUMARATE PO Take 6.2 mg by mouth daily at 2  am.    [provider]  sertraline (ZOLOFT) 50 MG tablet Take 75 mg by mouth. Take 1 and 1/2 tablet daily to equal 75 mg    [provider]    Family History  Problem Relation Age of Onset   Breast cancer Mother 77   Breast cancer Maternal Aunt 24   Breast cancer Maternal Grandmother        mat gm and mat great gm     Social History   Tobacco Use   Smoking status: Never  Substance Use Topics   Alcohol use: No   Drug use: No    Allergies as of 11/01/2021 - Review Complete 10/26/2021  Allergen Reaction Noted   Covid-19 (adenovirus) vaccine Cough 09/15/2021    Review of Systems:    All systems reviewed and negative except where noted in HPI.   Physical Exam:  There were no vitals taken for this visit. No LMP recorded. Patient is postmenopausal. Psych:  Alert and cooperative. Normal mood and affect. General:   Alert,  Well-developed, well-nourished, pleasant and cooperative in NAD Head:  Normocephalic and atraumatic. Eyes:  Sclera clear, no icterus.   Conjunctiva pink. Ears:  Normal auditory acuity. Neck:  Supple; no masses or thyromegaly. Lungs:  Respirations even and unlabored.  Clear  throughout to auscultation.   No wheezes, crackles, or rhonchi. No acute distress. Heart:  Regular rate and rhythm; no murmurs, clicks, rubs, or gallops. Abdomen:  Normal bowel sounds.  No bruits.  Soft, non-tender and non-distended without masses, hepatosplenomegaly or hernias noted.  No guarding or rebound tenderness.    Neurologic:  Alert and oriented x3;  grossly normal neurologically. Psych:  Alert and cooperative. Normal mood and affect.  Imaging Studies: No results found.  Assessment and Plan:   LIZZETT NOBILE is a 76 y.o. y/o female has been referred for ***  Follow up in ***  Dr Jonathon Bellows MD,MRCP(U.K)

## 2021-11-03 LAB — IMMUNOFIXATION INTE

## 2021-11-09 ENCOUNTER — Other Ambulatory Visit: Payer: Self-pay | Admitting: Nephrology

## 2021-11-09 DIAGNOSIS — N1831 Chronic kidney disease, stage 3a: Secondary | ICD-10-CM

## 2021-11-14 ENCOUNTER — Ambulatory Visit
Admission: RE | Admit: 2021-11-14 | Discharge: 2021-11-14 | Disposition: A | Discharge status: Home / Self Care | Payer: Medicare Other | Source: Ambulatory Visit | Attending: Nephrology | Admitting: Nephrology

## 2021-11-14 DIAGNOSIS — N1831 Chronic kidney disease, stage 3a: Secondary | ICD-10-CM | POA: Diagnosis present

## 2021-11-19 DIAGNOSIS — D472 Monoclonal gammopathy: Secondary | ICD-10-CM | POA: Insufficient documentation

## 2021-11-19 NOTE — Progress Notes (Signed)
[facility]  [phone] [phone]  ID: Teresa Hogan OB: 07-18-1945  MR#: [id number]  YPP#:[id number]  [name], MD as PCP - General (Internal Medicine)  CHIEF COMPLAINT: MGUS  INTERVAL HISTORY: Patient is a 76 year old female who was noted to have a positive M spike in the urine on work-up for chronic renal insufficiency.  She is referred for further evaluation.  She currently feels well and is asymptomatic.  She has no neurologic complaints.  She denies any recent fevers or illnesses.  She has a good appetite and denies weight loss.  She has no chest pain, shortness of breath, cough, or hemoptysis.  She denies any nausea, vomiting, constipation, or diarrhea.  She has no urinary complaints.  Patient feels at her baseline and offers no specific complaints today.  REVIEW OF SYSTEMS:   Review of Systems  Constitutional: Negative.  Negative for fever, malaise/fatigue and weight loss.  Respiratory: Negative.  Negative for cough, hemoptysis and shortness of breath.   Cardiovascular: Negative.  Negative for chest pain and leg swelling.  Gastrointestinal: Negative.  Negative for abdominal pain.  Genitourinary: Negative.  Negative for dysuria.  Musculoskeletal: Negative.  Negative for back pain.  Skin: Negative.  Negative for rash.  Neurological: Negative.  Negative for dizziness, focal weakness, weakness and headaches.  Psychiatric/Behavioral: Negative.  The patient is not nervous/anxious.    As per HPI. Otherwise, a complete review of systems is negative.  PAST MEDICAL HISTORY: Past Medical History:  Diagnosis Date   Cancer ([place])    skin ca   Depression     PAST SURGICAL HISTORY: Past Surgical History:  Procedure Laterality Date   BREAST BIOPSY Left    neg   NO PAST SURGERIES      FAMILY HISTORY: Family History  Problem Relation Age of Onset   Breast cancer Mother 88   Non-Hodgkin's lymphoma Father     Prostate cancer Brother    Breast cancer Maternal Aunt 47   Breast cancer Maternal Grandmother        mat gm and mat great gm    ADVANCED DIRECTIVES (Y/N):  N  HEALTH MAINTENANCE: Social History   Tobacco Use   Smoking status: Never  Vaping Use   Vaping Use: Never used  Substance Use Topics   Alcohol use: Yes    Alcohol/week: 1.0 standard drink    Types: 1 Cans of beer per week    Comment: 1 craft beer daily   Drug use: No     Colonoscopy:  PAP:  Bone density:  Lipid panel:  Allergies  Allergen Reactions   Covid-19 (Adenovirus) Vaccine Cough    Pfizer vaccine  and coughs    Current Outpatient Medications  Medication Sig Dispense Refill   Acetylcysteine 600 MG CAPS Take by mouth.     ascorbic acid (VITAMIN C) 500 MG tablet Take by mouth.     buPROPion (WELLBUTRIN SR) 200 MG 12 hr tablet Take by mouth.     Calcium Carb-Cholecalciferol 500-10 MG-MCG TABS Take 10 mg by mouth in the morning and at bedtime.     cetirizine (ZYRTEC) 10 MG tablet Take by mouth.     Cholecalciferol 50 MCG (2000 UT) CAPS Take 3 capsules daily for 3 months, then reduce to 1 capsule daily thereafter for Vitamin D Deficiency.     fluticasone (FLONASE) 50 MCG/ACT nasal spray Place into the nose.     glucosamine-chondroitin 500-400 MG tablet Take 1 tablet by mouth  3 (three) times daily.     loperamide (IMODIUM) 2 MG capsule Take by mouth.     melatonin 5 MG TABS Take 5 mg by mouth as needed.     Multiple Vitamin (MULTI-VITAMIN) tablet Take 1 tablet by mouth daily.     Quercetin 500 MG CAPS Take 1 capsule by mouth daily.     QUETIAPINE FUMARATE PO Take 6.2 mg by mouth daily at 2 am.     sertraline (ZOLOFT) 50 MG tablet Take 75 mg by mouth. Take 1 and 1/2 tablet daily to equal 75 mg     No current facility-administered medications for this visit.    OBJECTIVE: Vitals:   11/22/21 1135  BP: 104/73  Pulse: 84  Resp: 16  Temp: (!) 96.2 F (35.7 C)  SpO2: 100%     Body mass index is 21.81  kg/m.    ECOG FS:0 - Asymptomatic  General: Well-developed, well-nourished, no acute distress. Eyes: Pink conjunctiva, anicteric sclera. HEENT: Normocephalic, moist mucous membranes. Lungs: No audible wheezing or coughing. Heart: Regular rate and rhythm. Abdomen: Soft, nontender, no obvious distention. Musculoskeletal: No edema, cyanosis, or clubbing. Neuro: Alert, answering all questions appropriately. Cranial nerves grossly intact. Skin: No rashes or petechiae noted. Psych: Normal affect. Lymphatics: No cervical, calvicular, axillary or inguinal LAD.   LAB RESULTS:  Lab Results  Component Value Date   NA 140 09/15/2021   K 4.3 09/15/2021   CL 105 09/15/2021   CO2 30 09/15/2021   GLUCOSE 83 09/15/2021   BUN 27 (H) 09/15/2021   CREATININE 1.05 (H) 09/15/2021   CALCIUM 10.3 09/15/2021   PROT 6.9 10/26/2021   ALBUMIN 3.7 01/07/2014   AST 18 09/15/2021   ALT 13 09/15/2021   ALKPHOS 77 01/07/2014   BILITOT 0.4 09/15/2021   GFRNONAA 59 (L) 03/10/2021   GFRAA 58 (L) 01/07/2014    Lab Results  Component Value Date   WBC 6.2 09/15/2021   NEUTROABS 3,801 09/15/2021   HGB 13.9 09/15/2021   HCT 40.3 09/15/2021   MCV 94.8 09/15/2021   PLT 239 09/15/2021     STUDIES: US RENAL  Result Date: 11/15/2021 CLINICAL DATA:  Chronic renal disease EXAM: RENAL / URINARY TRACT ULTRASOUND COMPLETE COMPARISON:  None Available. FINDINGS: Right Kidney: Renal measurements: 10.1 x 4.6 x 4.8 cm = volume: 118 mL. Echogenicity within normal limits. No mass or hydronephrosis visualized. Left Kidney: Renal measurements: 10.2 x 4.8 x 4.2 cm = volume: 107 mL. Contains a 1.7 cm simple cyst in the medial upper pole of no clinical significance. No follow-up imaging recommended. Bladder: Appears normal for degree of bladder distention. Other: None. IMPRESSION: No cause for chronic renal disease identified. No significant abnormalities. Electronically Signed   By: Dorise Bullion III M.D.   On: 11/15/2021  16:13    ASSESSMENT: MGUS  PLAN:    MGUS: Previously, patient's SPEP was negative.  Repeat SPEP and UPEP are pending at time of dictation.  Immunoglobulins are within normal limits, but patient noted to have a mildly elevated kappa free light chain of 38.5.  She has no evidence of endorgan damage.  She does not require imaging or bone marrow biopsy at this time.  No intervention is needed.  Patient will have video-assisted telemedicine visit in approximately 3 weeks for further evaluation and discussion of her results. Chronic renal insufficiency: Continue monitoring and treatment per nephrology.  I spent a total of 45 minutes reviewing chart data, face-to-face evaluation with the patient, counseling and coordination of  care as detailed above.   Patient expressed understanding and was in agreement with this plan. She also understands that She can call clinic at any time with any questions, concerns, or complaints.    Lloyd Huger, MD   11/24/2021 7:08 AM

## 2021-11-22 ENCOUNTER — Encounter (INDEPENDENT_AMBULATORY_CARE_PROVIDER_SITE_OTHER): Payer: Self-pay

## 2021-11-22 ENCOUNTER — Inpatient Hospital Stay: Payer: Medicare Other

## 2021-11-22 ENCOUNTER — Inpatient Hospital Stay: Payer: Medicare Other | Attending: Oncology | Admitting: Oncology

## 2021-11-22 ENCOUNTER — Encounter: Payer: Self-pay | Admitting: Oncology

## 2021-11-22 DIAGNOSIS — N189 Chronic kidney disease, unspecified: Secondary | ICD-10-CM | POA: Diagnosis not present

## 2021-11-22 DIAGNOSIS — Z8042 Family history of malignant neoplasm of prostate: Secondary | ICD-10-CM | POA: Diagnosis not present

## 2021-11-22 DIAGNOSIS — Z807 Family history of other malignant neoplasms of lymphoid, hematopoietic and related tissues: Secondary | ICD-10-CM | POA: Diagnosis not present

## 2021-11-22 DIAGNOSIS — Z79899 Other long term (current) drug therapy: Secondary | ICD-10-CM | POA: Diagnosis not present

## 2021-11-22 DIAGNOSIS — D472 Monoclonal gammopathy: Secondary | ICD-10-CM | POA: Diagnosis present

## 2021-11-22 DIAGNOSIS — Z803 Family history of malignant neoplasm of breast: Secondary | ICD-10-CM | POA: Insufficient documentation

## 2021-11-22 NOTE — Progress Notes (Signed)
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

## 2021-11-23 LAB — IGG, IGA, IGM
IgA: 310 mg/dL (ref 64–422)
IgG (Immunoglobin G), Serum: 922 mg/dL (ref 586–1602)
IgM (Immunoglobulin M), Srm: 154 mg/dL (ref 26–217)

## 2021-11-23 LAB — KAPPA/LAMBDA LIGHT CHAINS
Kappa free light chain: 38.5 mg/L — ABNORMAL HIGH (ref 3.3–19.4)
Kappa, lambda light chain ratio: 1.81 — ABNORMAL HIGH (ref 0.26–1.65)
Lambda free light chains: 21.3 mg/L (ref 5.7–26.3)

## 2021-11-24 LAB — PROTEIN ELECTROPHORESIS, SERUM
A/G Ratio: 1.4 (ref 0.7–1.7)
Albumin ELP: 4 g/dL (ref 2.9–4.4)
Alpha-1-Globulin: 0.2 g/dL (ref 0.0–0.4)
Alpha-2-Globulin: 0.7 g/dL (ref 0.4–1.0)
Beta Globulin: 1.1 g/dL (ref 0.7–1.3)
Gamma Globulin: 0.9 g/dL (ref 0.4–1.8)
Globulin, Total: 2.9 g/dL (ref 2.2–3.9)
Total Protein ELP: 6.9 g/dL (ref 6.0–8.5)

## 2021-11-24 LAB — PROTEIN ELECTRO, RANDOM URINE
Albumin ELP, Urine: 100 %
Alpha-1-Globulin, U: 0 %
Alpha-2-Globulin, U: 0 %
Beta Globulin, U: 0 %
Gamma Globulin, U: 0 %
Total Protein, Urine: 10.4 mg/dL

## 2021-12-12 ENCOUNTER — Encounter: Payer: Self-pay | Admitting: Oncology

## 2021-12-12 ENCOUNTER — Inpatient Hospital Stay: Payer: Medicare Other | Attending: Oncology | Admitting: Oncology

## 2021-12-12 DIAGNOSIS — R778 Other specified abnormalities of plasma proteins: Secondary | ICD-10-CM

## 2021-12-12 NOTE — Progress Notes (Signed)
[facility]  [phone] [phone]) [phone]  ID: Teresa Hogan OB: [birth year]  MR#: [id number]  HCW#:[id number]  [name], MD as PCP - General (Internal Medicine)  I connected with Marcelle Overlie on 12/12/21 at  3:30 PM EDT by video enabled telemedicine visit and verified that I am speaking with the correct person using two identifiers.   I discussed the limitations, risks, security and privacy concerns of performing an evaluation and management service by telemedicine and the availability of in-person appointments. I also discussed with the patient that there may be a patient responsible charge related to this service. The patient expressed understanding and agreed to proceed.   Other persons participating in the visit and their role in the encounter: Patient, MD.  Patient's location: Home. Provider's location: Clinic.  CHIEF COMPLAINT: Elevated kappa chains.  INTERVAL HISTORY: Patient agreed to video assisted telemedicine visit for further evaluation and discussion of her laboratory results.  She continues to feel well and remains asymptomatic. She has no neurologic complaints.  She denies any recent fevers or illnesses.  She has a good appetite and denies weight loss.  She has no chest pain, shortness of breath, cough, or hemoptysis.  She denies any nausea, vomiting, constipation, or diarrhea.  She has no urinary complaints.  Patient offers no specific complaints today.  REVIEW OF SYSTEMS:   Review of Systems  Constitutional: Negative.  Negative for fever, malaise/fatigue and weight loss.  Respiratory: Negative.  Negative for cough, hemoptysis and shortness of breath.   Cardiovascular: Negative.  Negative for chest pain and leg swelling.  Gastrointestinal: Negative.  Negative for abdominal pain.  Genitourinary: Negative.  Negative for dysuria.  Musculoskeletal: Negative.  Negative for back pain.  Skin: Negative.   Negative for rash.  Neurological: Negative.  Negative for dizziness, focal weakness, weakness and headaches.  Psychiatric/Behavioral: Negative.  The patient is not nervous/anxious.     As per HPI. Otherwise, a complete review of systems is negative.  PAST MEDICAL HISTORY: Past Medical History:  Diagnosis Date   Cancer ([place])    skin ca   Depression     PAST SURGICAL HISTORY: Past Surgical History:  Procedure Laterality Date   BREAST BIOPSY Left    neg   NO PAST SURGERIES      FAMILY HISTORY: Family History  Problem Relation Age of Onset   Breast cancer Mother 24   Non-Hodgkin's lymphoma Father    Prostate cancer Brother    Breast cancer Maternal Aunt 54   Breast cancer Maternal Grandmother        mat gm and mat great gm    ADVANCED DIRECTIVES (Y/N):  N  HEALTH MAINTENANCE: Social History   Tobacco Use   Smoking status: Never  Vaping Use   Vaping Use: Never used  Substance Use Topics   Alcohol use: Yes    Alcohol/week: 1.0 standard drink of alcohol    Types: 1 Cans of beer per week    Comment: 1 craft beer daily   Drug use: No     Colonoscopy:  PAP:  Bone density:  Lipid panel:  Allergies  Allergen Reactions   Covid-19 (Adenovirus) Vaccine Cough    Pfizer vaccine  and coughs    Current Outpatient Medications  Medication Sig Dispense Refill   Acetylcysteine 600 MG CAPS Take by mouth.     ascorbic acid (VITAMIN C) 500 MG tablet Take by mouth.     buPROPion (WELLBUTRIN SR) 200 MG  12 hr tablet Take by mouth.     Calcium Carb-Cholecalciferol 500-10 MG-MCG TABS Take 10 mg by mouth in the morning and at bedtime.     cetirizine (ZYRTEC) 10 MG tablet Take by mouth.     Cholecalciferol 50 MCG (2000 UT) CAPS Take 3 capsules daily for 3 months, then reduce to 1 capsule daily thereafter for Vitamin D Deficiency.     fluticasone (FLONASE) 50 MCG/ACT nasal spray Place into the nose.     glucosamine-chondroitin 500-400 MG tablet Take 1 tablet by mouth 3 (three)  times daily.     loperamide (IMODIUM) 2 MG capsule Take by mouth.     melatonin 5 MG TABS Take 5 mg by mouth as needed.     Multiple Vitamin (MULTI-VITAMIN) tablet Take 1 tablet by mouth daily.     Probiotic Product (PROBIOTIC-10 PO) Take by mouth.     Quercetin 500 MG CAPS Take 1 capsule by mouth daily.     QUETIAPINE FUMARATE PO Take 6.2 mg by mouth daily at 2 am.     sertraline (ZOLOFT) 50 MG tablet Take 75 mg by mouth. Take 1 and 1/2 tablet daily to equal 75 mg     No current facility-administered medications for this visit.    OBJECTIVE: There were no vitals filed for this visit.    There is no height or weight on file to calculate BMI.    ECOG FS:0 - Asymptomatic  General: Well-developed, well-nourished, no acute distress. HEENT: Normocephalic. Neuro: Alert, answering all questions appropriately. Cranial nerves grossly intact. Psych: Normal affect.  LAB RESULTS:  Lab Results  Component Value Date   NA 140 09/15/2021   K 4.3 09/15/2021   CL 105 09/15/2021   CO2 30 09/15/2021   GLUCOSE 83 09/15/2021   BUN 27 (H) 09/15/2021   CREATININE 1.05 (H) 09/15/2021   CALCIUM 10.3 09/15/2021   PROT 6.9 10/26/2021   ALBUMIN 3.7 01/07/2014   AST 18 09/15/2021   ALT 13 09/15/2021   ALKPHOS 77 01/07/2014   BILITOT 0.4 09/15/2021   GFRNONAA 59 (L) 03/10/2021   GFRAA 58 (L) 01/07/2014    Lab Results  Component Value Date   WBC 6.2 09/15/2021   NEUTROABS 3,801 09/15/2021   HGB 13.9 09/15/2021   HCT 40.3 09/15/2021   MCV 94.8 09/15/2021   PLT 239 09/15/2021     STUDIES: US RENAL  Result Date: 11/15/2021 CLINICAL DATA:  Chronic renal disease EXAM: RENAL / URINARY TRACT ULTRASOUND COMPLETE COMPARISON:  None Available. FINDINGS: Right Kidney: Renal measurements: 10.1 x 4.6 x 4.8 cm = volume: 118 mL. Echogenicity within normal limits. No mass or hydronephrosis visualized. Left Kidney: Renal measurements: 10.2 x 4.8 x 4.2 cm = volume: 107 mL. Contains a 1.7 cm simple cyst in the  medial upper pole of no clinical significance. No follow-up imaging recommended. Bladder: Appears normal for degree of bladder distention. Other: None. IMPRESSION: No cause for chronic renal disease identified. No significant abnormalities. Electronically Signed   By: Dorise Bullion III M.D.   On: 11/15/2021 16:13    ASSESSMENT: Elevated kappa chains.  PLAN:    Elevated kappa chains: SPEP, UPEP, and immunoglobulins all either negative or within normal limits.  Patient has a mildly elevated kappa free light chain of 38.5.  She has mild chronic renal insufficiency, but no other evidence of endorgan damage.  No intervention is needed at this time.  She does not require imaging or bone marrow biopsy at this time.  Return  to clinic in 6 months with repeat laboratory work and video assisted telemedicine visit.   Chronic renal insufficiency: Continue monitoring and treatment per nephrology.   I provided 20 minutes of face-to-face video visit time during this encounter which included chart review, counseling, and coordination of care as documented above.   Patient expressed understanding and was in agreement with this plan. She also understands that She can call clinic at any time with any questions, concerns, or complaints.    Lloyd Huger, MD   12/12/2021 3:55 PM

## 2021-12-19 DIAGNOSIS — N182 Chronic kidney disease, stage 2 (mild): Secondary | ICD-10-CM | POA: Diagnosis not present

## 2021-12-19 DIAGNOSIS — D472 Monoclonal gammopathy: Secondary | ICD-10-CM | POA: Diagnosis not present

## 2021-12-26 ENCOUNTER — Ambulatory Visit (INDEPENDENT_AMBULATORY_CARE_PROVIDER_SITE_OTHER): Payer: Medicare Other

## 2021-12-26 VITALS — Ht 70.0 in | Wt 152.0 lb

## 2021-12-26 DIAGNOSIS — Z0000 Encounter for general adult medical examination without abnormal findings: Secondary | ICD-10-CM

## 2021-12-26 NOTE — Patient Instructions (Addendum)
  Teresa Hogan , Thank you for taking time to come for your Medicare Wellness Visit. I appreciate your ongoing commitment to your health goals. Please review the following plan we discussed and let me know if I can assist you in the future.   These are the goals we discussed:  Goals      Maintain healthy lifestyle     Stay active Healthy diet Stay hydrated        This is a list of the screening recommended for you and due dates:  Health Maintenance  Topic Date Due   COVID-19 Vaccine (4 - Booster for Pfizer series) 01/11/2022*   DEXA scan (bone density measurement)  12/27/2022*   Flu Shot  01/30/2022   Zoster (Shingles) Vaccine  Completed   HPV Vaccine  Aged Out   Pneumonia Vaccine  Discontinued   Tetanus Vaccine  Discontinued   Hepatitis C Screening: USPSTF Recommendation to screen - Ages 62-79 yo.  Discontinued  *Topic was postponed. The date shown is not the original due date.

## 2021-12-26 NOTE — Progress Notes (Addendum)
Subjective:   Teresa Hogan is a 76 y.o. female who presents for an Initial Medicare Annual Wellness Visit.  Review of Systems    No ROS.  Medicare Wellness Virtual Visit.  Visual/audio telehealth visit, UTA vital signs.   See social history for additional risk factors.   Cardiac Risk Factors include: advanced age (>37mn, >>26women)     Objective:    Today's Vitals   12/26/21 1112  Weight: 152 lb (68.9 kg)  Height: '5\' 10"'$  (1.778 m)   Body mass index is 21.81 kg/m.     12/26/2021   11:14 AM 12/12/2021    2:23 PM 11/22/2021   11:12 AM  Advanced Directives  Does Patient Have a Medical Advance Directive? Yes Yes Yes  Type of AParamedicof AAckerlyLiving will Healthcare Power of ADerbyLiving will  Does patient want to make changes to medical advance directive? No - Patient declined    Copy of [place] Chart? No - copy requested  No - copy requested   Current Medications (verified) Outpatient Encounter Medications as of 12/26/2021  Medication Sig   Acetylcysteine 600 MG CAPS Take by mouth.   ascorbic acid (VITAMIN C) 500 MG tablet Take by mouth.   buPROPion (WELLBUTRIN SR) 200 MG 12 hr tablet Take by mouth.   Calcium Carb-Cholecalciferol 500-10 MG-MCG TABS Take 10 mg by mouth in the morning and at bedtime.   cetirizine (ZYRTEC) 10 MG tablet Take by mouth.   Cholecalciferol 50 MCG (2000 UT) CAPS Take 3 capsules daily for 3 months, then reduce to 1 capsule daily thereafter for Vitamin D Deficiency.   fluticasone (FLONASE) 50 MCG/ACT nasal spray Place into the nose.   glucosamine-chondroitin 500-400 MG tablet Take 1 tablet by mouth 3 (three) times daily.   loperamide (IMODIUM) 2 MG capsule Take by mouth.   melatonin 5 MG TABS Take 5 mg by mouth as needed.   Multiple Vitamin (MULTI-VITAMIN) tablet Take 1 tablet by mouth daily.   Probiotic Product (PROBIOTIC-10 PO) Take by mouth.   Quercetin 500  MG CAPS Take 1 capsule by mouth daily.   QUETIAPINE FUMARATE PO Take 6.2 mg by mouth daily at 2 am.   sertraline (ZOLOFT) 50 MG tablet Take 75 mg by mouth. Take 1 and 1/2 tablet daily to equal 75 mg   No facility-administered encounter medications on file as of 12/26/2021.    Allergies (verified) Covid-19 (adenovirus) vaccine   History: Past Medical History:  Diagnosis Date   Cancer ([place]    skin ca   Depression    Past Surgical History:  Procedure Laterality Date   BREAST BIOPSY Left    neg   NO PAST SURGERIES     Family History  Problem Relation Age of Onset   Breast cancer Mother 654  Non-Hodgkin's lymphoma Father    Prostate cancer Brother    Breast cancer Maternal Aunt 426  Breast cancer Maternal Grandmother        mat gm and mat great gm   Social History   Socioeconomic History   Marital status: Married    Spouse name: Not on file   Number of children: Not on file   Years of education: Not on file   Highest education level: Not on file  Occupational History   Not on file  Tobacco Use   Smoking status: Never   Smokeless tobacco: Not on file  Vaping Use   Vaping  Use: Never used  Substance and Sexual Activity   Alcohol use: Yes    Alcohol/week: 1.0 standard drink of alcohol    Types: 1 Cans of beer per week    Comment: 1 craft beer daily   Drug use: No   Sexual activity: Not on file  Other Topics Concern   Not on file  Social History Narrative   Not on file   Social Determinants of Health   Financial Resource Strain: Low Risk  (12/26/2021)   Overall Financial Resource Strain (CARDIA)    Difficulty of Paying Living Expenses: Not hard at all  Food Insecurity: No Food Insecurity (12/26/2021)   Hunger Vital Sign    Worried About Running Out of Food in the Last Year: Never true    Ran Out of Food in the Last Year: Never true  Transportation Needs: No Transportation Needs (12/26/2021)   PRAPARE - Hydrologist (Medical): No     Lack of Transportation (Non-Medical): No  Physical Activity: Insufficiently Active (12/26/2021)   Exercise Vital Sign    Days of Exercise per Week: 7 days    Minutes of Exercise per Session: 20 min  Stress: No Stress Concern Present (12/26/2021)   [place]    Feeling of Stress : Not at all  Social Connections: Unknown (12/26/2021)   Social Connection and Isolation Panel [NHANES]    Frequency of Communication with Friends and Family: Not on file    Frequency of Social Gatherings with Friends and Family: Not on file    Attends Religious Services: Not on file    Active Member of Clubs or Organizations: Not on file    Attends Archivist Meetings: Not on file    Marital Status: Married   Tobacco Counseling Counseling given: Not Answered   Clinical Intake:  Pre-visit preparation completed: Yes        Diabetes: No  How often do you need to have someone help you when you read instructions, pamphlets, or other written materials from your doctor or pharmacy?: 1 - Never   Interpreter Needed?: No    Activities of Daily Living    12/26/2021   11:16 AM  In your present state of health, do you have any difficulty performing the following activities:  Hearing? 0  Vision? 0  Difficulty concentrating or making decisions? 0  Walking or climbing stairs? 0  Dressing or bathing? 0  Doing errands, shopping? 0  Preparing Food and eating ? N  Using the Toilet? N  In the past six months, have you accidently leaked urine? N  Do you have problems with loss of bowel control? N  Managing your Medications? N  Managing your Finances? N  Housekeeping or managing your Housekeeping? N    [name], MD as PCP - General (Internal Medicine)  Indicate any recent Medical Services you may have received from other than [facility] providers in the past year (date may be approximate).     Assessment:    This is a routine wellness examination for Teresa Hogan.  Virtual Visit via Telephone Note  I connected with  Teresa Hogan on 12/26/21 at 11:15 AM EDT by telephone and verified that I am speaking with the correct person using two identifiers.  Persons participating in the virtual visit: patient/Nurse Health Advisor   I discussed the limitations of performing an evaluation and management service by telehealth. We continued and completed  visit with audio only. Some vital signs may be absent or patient reported.   Hearing/Vision screen Hearing Screening - Comments:: Patient is able to hear conversational tones without difficulty.  No issues reported. Vision Screening - Comments:: Followed by [facility] Wears corrective lenses They have seen their ophthalmologist in the last 12 months.   Dietary issues and exercise activities discussed: Current Exercise Habits: Home exercise routine, Type of exercise: walking, Time (Minutes): 20, Frequency (Times/Week): 7, Weekly Exercise (Minutes/Week): 140, Intensity: Mild   Goals Addressed             This Visit's Progress    Maintain healthy lifestyle       Stay active Healthy diet Stay hydrated       Depression Screen    12/26/2021   11:31 AM 10/26/2021   12:49 PM 09/15/2021    3:14 PM  PHQ 2/9 Scores  PHQ - 2 Score  0 0  PHQ- 9 Score  0   Exception Documentation Other- indicate reason in comment box      Fall Risk    12/26/2021   11:15 AM 10/26/2021   12:48 PM 09/15/2021    3:14 PM  [place] in the past year? 0 0 1  Number falls in past yr:  0 0  Injury with Fall?  0 1  Risk for fall due to :  No Fall Risks History of fall(s)  Follow up Falls evaluation completed Falls evaluation completed Falls evaluation completed    [place]: Home free of loose throw rugs in walkways, pet beds, electrical cords, etc? Yes  Adequate lighting in your home to reduce risk of falls? Yes    ASSISTIVE DEVICES UTILIZED TO PREVENT FALLS: Life alert? No  Use of a cane, walker or w/c? No   TIMED UP AND GO: Was the test performed? No .   Cognitive Function:  Patient is alert and oriented x3.   Enjoys reading, hard sodoku and hard crossword puzzle.    Immunizations Immunization History  Administered Date(s) Administered   Influenza, High Dose Seasonal PF 03/03/2019   Influenza-Unspecified 03/16/2020   PFIZER Comirnaty(Gray Top)Covid-19 Tri-Sucrose Vaccine 08/24/2019, 09/15/2019, 04/28/2020   Tdap 03/07/2018   Zoster Recombinat (Shingrix) 07/03/2017, 01/16/2018   PNA vaccine- discontinued per patient.   Screening Tests Health Maintenance  Topic Date Due   COVID-19 Vaccine (4 - Booster for Pfizer series) 01/11/2022 (Originally 06/23/2020)   DEXA SCAN  12/27/2022 (Originally 07/10/2010)   INFLUENZA VACCINE  01/30/2022   Zoster Vaccines- Shingrix  Completed   HPV VACCINES  Aged Out   Pneumonia Vaccine 57+ Years old  Discontinued   TETANUS/TDAP  Discontinued   Hepatitis C Screening  Discontinued   Health Maintenance There are no preventive care reminders to display for this patient.  Mammogram- agrees to schedule  Bone Density- deferred per patient.   Lung Cancer Screening: (Low Dose CT Chest recommended if Age 71-80 years, 30 pack-year currently smoking OR have quit w/in 15years.) does not qualify.   Hepatitis C Screening: discontinued per patient.   Vision Screening: Recommended annual ophthalmology exams for early detection of glaucoma and other disorders of the eye.  Dental Screening: Recommended annual dental exams for proper oral hygiene  Community Resource Referral / Chronic Care Management: CRR required this visit?  No   CCM required this visit?  No      Plan:   Keep all routine maintenance appointments.   I have  personally reviewed and noted the following in the patient's chart:   Medical and social history Use of alcohol, tobacco or illicit  drugs  Current medications and supplements including opioid prescriptions. Patient is not currently taking opioid prescriptions. Functional ability and status Nutritional status Physical activity Advanced directives List of other physicians Hospitalizations, surgeries, and ER visits in previous 12 months Vitals Screenings to include cognitive, depression, and falls Referrals and appointments  In addition, I have reviewed and discussed with patient certain preventive protocols, quality metrics, and best practice recommendations. A written personalized care plan for preventive services as well as general preventive health recommendations were provided to patient.     OBrien-Blaney, Kimerly Rowand L, LPN   [id number]/73/[id number]      I have reviewed the above information and agree with above.   Deborra Medina, MD

## 2022-01-03 DIAGNOSIS — N182 Chronic kidney disease, stage 2 (mild): Secondary | ICD-10-CM | POA: Diagnosis not present

## 2022-01-16 ENCOUNTER — Telehealth: Payer: Self-pay | Admitting: Gastroenterology

## 2022-01-16 ENCOUNTER — Ambulatory Visit
Admission: RE | Admit: 2022-01-16 | Discharge: 2022-01-16 | Disposition: A | Discharge status: Home / Self Care | Payer: Medicare Other | Source: Ambulatory Visit | Attending: Internal Medicine | Admitting: Internal Medicine

## 2022-01-16 DIAGNOSIS — Z1231 Encounter for screening mammogram for malignant neoplasm of breast: Secondary | ICD-10-CM | POA: Insufficient documentation

## 2022-01-16 NOTE — Telephone Encounter (Signed)
Thayer Headings from [facility] called and left a vm to ask about patients upcoming appt with [place]. I tried to call Thayer Headings back and got hung up on twice.

## 2022-02-07 ENCOUNTER — Telehealth: Payer: Self-pay | Admitting: Internal Medicine

## 2022-02-07 NOTE — Telephone Encounter (Signed)
Pt called needing a response to an email sent ten days ago to the cma

## 2022-02-07 NOTE — Telephone Encounter (Signed)
Spoke with pt to let her know that I have not received an email from her. Pt stated that she was given an email that she could sent stuff to and I explained to her that I do not have access to that email. Pt stated that she had emailed [place] an order for blood work to be done at our office for another provider. Pt was advised that it best way to get that to [place] since she does not have mychart is to make a copy and drop it off at our office. Pt gave a verbal understanding to that. Pt also wants to know if she needs to keep her appt for colonoscopy on Monday. She stated that she was referred for colonoscopy because she had been having daily diarrhea and you wanted to see if maybe she had "microscopic colitis". Pt stated that she has changed her yogurt to Activa and since then her bowels are now normal. So since bowels are normal now she would like to know if she should go through with the colonoscopy.

## 2022-02-07 NOTE — Telephone Encounter (Signed)
Spoke with Teresa Hogan to let her know that Dr. Derrel Nip recommends that she go ahead with the colonoscopy because if the diarrhea returns she will have to wait again to get  back on the schedule. Teresa Hogan gave a verbal understanding.

## 2022-02-12 ENCOUNTER — Encounter: Payer: Self-pay | Admitting: Gastroenterology

## 2022-02-12 ENCOUNTER — Ambulatory Visit: Payer: Medicare Other | Admitting: Gastroenterology

## 2022-02-12 VITALS — BP 121/75 | HR 73 | Temp 97.9°F | Ht 70.0 in | Wt 147.0 lb

## 2022-02-12 DIAGNOSIS — R197 Diarrhea, unspecified: Secondary | ICD-10-CM | POA: Diagnosis not present

## 2022-02-12 NOTE — Progress Notes (Signed)
Gastroenterology Consultation  Referring Provider:     Crecencio Mc, MD Primary Care Physician:  Crecencio Mc, MD Primary Gastroenterologist:  Dr. Allen Norris     Reason for Consultation:     Diarrhea and weight loss        HPI:   Teresa Hogan is a 76 y.o. y/o female referred for consultation & management of diarrhea and weight loss by Dr. Derrel Nip, Aris Everts, MD. this patient comes to see me today after being seen in the past by Dr. Alice Reichert.  The patient had a colonoscopy in 2020 for diarrhea that she states had been different than what she had recently.  The patient now reports that at the beginning of the year she started to have diarrhea with weight loss but started to take a probiotic and consulted with an expert in microbiome and started on a different probiotic.  The patient states that she has continued to gain her weight back and has not lost any further weight and also has had the diarrhea resolved.  There is no report of any black stools or bloody stools.  She also denies any abdominal pain.  Past Medical History:  Diagnosis Date   Cancer ([place])    skin ca   Depression     Past Surgical History:  Procedure Laterality Date   BREAST BIOPSY Left    neg   BREAST EXCISIONAL BIOPSY Left     Prior to Admission medications   Medication Sig Start Date End Date Taking? Authorizing Provider  Acetylcysteine 600 MG CAPS Take by mouth.   Yes [provider]  ascorbic acid (VITAMIN C) 500 MG tablet Take by mouth.   Yes [provider]  buPROPion (WELLBUTRIN SR) 200 MG 12 hr tablet Take by mouth. 09/02/19  Yes [provider]  Calcium Carb-Cholecalciferol 500-10 MG-MCG TABS Take 10 mg by mouth in the morning and at bedtime.   Yes [provider]  cetirizine (ZYRTEC) 10 MG tablet Take by mouth.   Yes [provider]  Cholecalciferol 50 MCG (2000 UT) CAPS Take 3 capsules daily for 3 months, then reduce to 1 capsule daily thereafter for Vitamin  D Deficiency. 05/12/19  Yes [provider]  fluticasone (FLONASE) 50 MCG/ACT nasal spray Place into the nose.   Yes [provider]  glucosamine-chondroitin 500-400 MG tablet Take 1 tablet by mouth 3 (three) times daily.   Yes [provider]  loperamide (IMODIUM) 2 MG capsule Take by mouth.   Yes [provider]  melatonin 5 MG TABS Take 5 mg by mouth as needed.   Yes [provider]  Multiple Vitamin (MULTI-VITAMIN) tablet Take 1 tablet by mouth daily.   Yes [provider]  Probiotic Product (PROBIOTIC-10 PO) Take by mouth.   Yes [provider]  Quercetin 500 MG CAPS Take 1 capsule by mouth daily.   Yes [provider]  QUETIAPINE FUMARATE PO Take 6.2 mg by mouth daily at 2 am.   Yes [provider]  sertraline (ZOLOFT) 50 MG tablet Take 75 mg by mouth. Take 1 and 1/2 tablet daily to equal 75 mg   Yes [provider]    Family History  Problem Relation Age of Onset   Breast cancer Mother 39   Non-Hodgkin's lymphoma Father    Prostate cancer Brother    Breast cancer Maternal Aunt 59   Breast cancer Maternal Grandmother        mat gm and mat  great gm     Social History   Tobacco Use   Smoking status: Never  Vaping Use   Vaping Use: Never used  Substance Use Topics   Alcohol use: Yes    Alcohol/week: 1.0 standard drink of alcohol    Types: 1 Cans of beer per week    Comment: 1 craft beer daily   Drug use: No    Allergies as of 02/12/2022 - Review Complete 02/12/2022  Allergen Reaction Noted   Covid-19 (adenovirus) vaccine Cough 09/15/2021    Review of Systems:    All systems reviewed and negative except where noted in HPI.   Physical Exam:  BP 121/75   Pulse 73   Temp 97.9 F (36.6 C) (Oral)   Ht '5\' 10"'$  (1.778 m)   Wt 147 lb (66.7 kg)   BMI 21.09 kg/m  No LMP recorded. Patient is postmenopausal. General:   Alert,  Well-developed, well-nourished, pleasant and cooperative in  NAD Head:  Normocephalic and atraumatic. Eyes:  Sclera clear, no icterus.   Conjunctiva pink. Ears:  Normal auditory acuity. Neck:  Supple; no masses or thyromegaly. Lungs:  Respirations even and unlabored.  Clear throughout to auscultation.   No wheezes, crackles, or rhonchi. No acute distress. Heart:  Regular rate and rhythm; no murmurs, clicks, rubs, or gallops. Abdomen:  Normal bowel sounds.  No bruits.  Soft, non-tender and non-distended without masses, hepatosplenomegaly or hernias noted.  No guarding or rebound tenderness.  Negative Carnett sign.   Rectal:  Deferred.  Pulses:  Normal pulses noted. Extremities:  No clubbing or edema.  No cyanosis. Neurologic:  Alert and oriented x3;  grossly normal neurologically. Skin:  Intact without significant lesions or rashes.  No jaundice. Lymph Nodes:  No significant cervical adenopathy. Psych:  Alert and cooperative. Normal mood and affect.  Imaging Studies: MM 3D SCREEN BREAST BILATERAL  Result Date: 01/17/2022 CLINICAL DATA:  Screening. EXAM: DIGITAL SCREENING BILATERAL MAMMOGRAM WITH TOMOSYNTHESIS AND CAD TECHNIQUE: Bilateral screening digital craniocaudal and mediolateral oblique mammograms were obtained. Bilateral screening digital breast tomosynthesis was performed. The images were evaluated with computer-aided detection. COMPARISON:  Previous exam(s). ACR Breast Density Category c: The breast tissue is heterogeneously dense, which may obscure small masses. FINDINGS: There are no findings suspicious for malignancy. IMPRESSION: No mammographic evidence of malignancy. A result letter of this screening mammogram will be mailed directly to the patient. RECOMMENDATION: Screening mammogram in one year. (Code:SM-B-01Y) BI-RADS CATEGORY  1: Negative. Electronically Signed   By: Everlean Alstrom M.D.   On: 01/17/2022 10:49    Assessment and Plan:   Teresa Hogan is a 76 y.o. y/o female who comes in with a concern of microscopic colitis.  The  patient had a colonoscopy 3 years ago for diarrhea that was different than what her most recent episodes were.  The patient states that she is doing well on her probiotic and taking Imodium as needed.  The patient has been told that the only difference with the diagnosis of microscopic colitis would be whether or not we started on budesonide.  She states that she would like to hold off on any endoscopic procedures because she is doing well at this time and stopped losing weight.  I have told her that I agree with her plan since she is responding to her present probiotic and Imodium and there would be no need even if she was found to have microscopic colitis to start her on budesonide.  The patient has been explained the  plan and agrees with it.  She will contact me if any of her symptoms change.    Lucilla Lame, MD. Marval Regal    Note: This dictation was prepared with Dragon dictation along with smaller phrase technology. Any transcriptional errors that result from this process are unintentional.

## 2022-02-21 DIAGNOSIS — F3342 Major depressive disorder, recurrent, in full remission: Secondary | ICD-10-CM | POA: Diagnosis not present

## 2022-04-27 IMAGING — CR DG CHEST 2V
1 series · 2 of 2 positions shown · non-contrast
Comparison: None.

CLINICAL DATA: Chest pain, history of recent sternal fracture

EXAM:
CHEST - 2 VIEW

[Series 1: dg chest 2 view · 0.14mm/px · 2 of 2 slices shown]
[im 1/2]
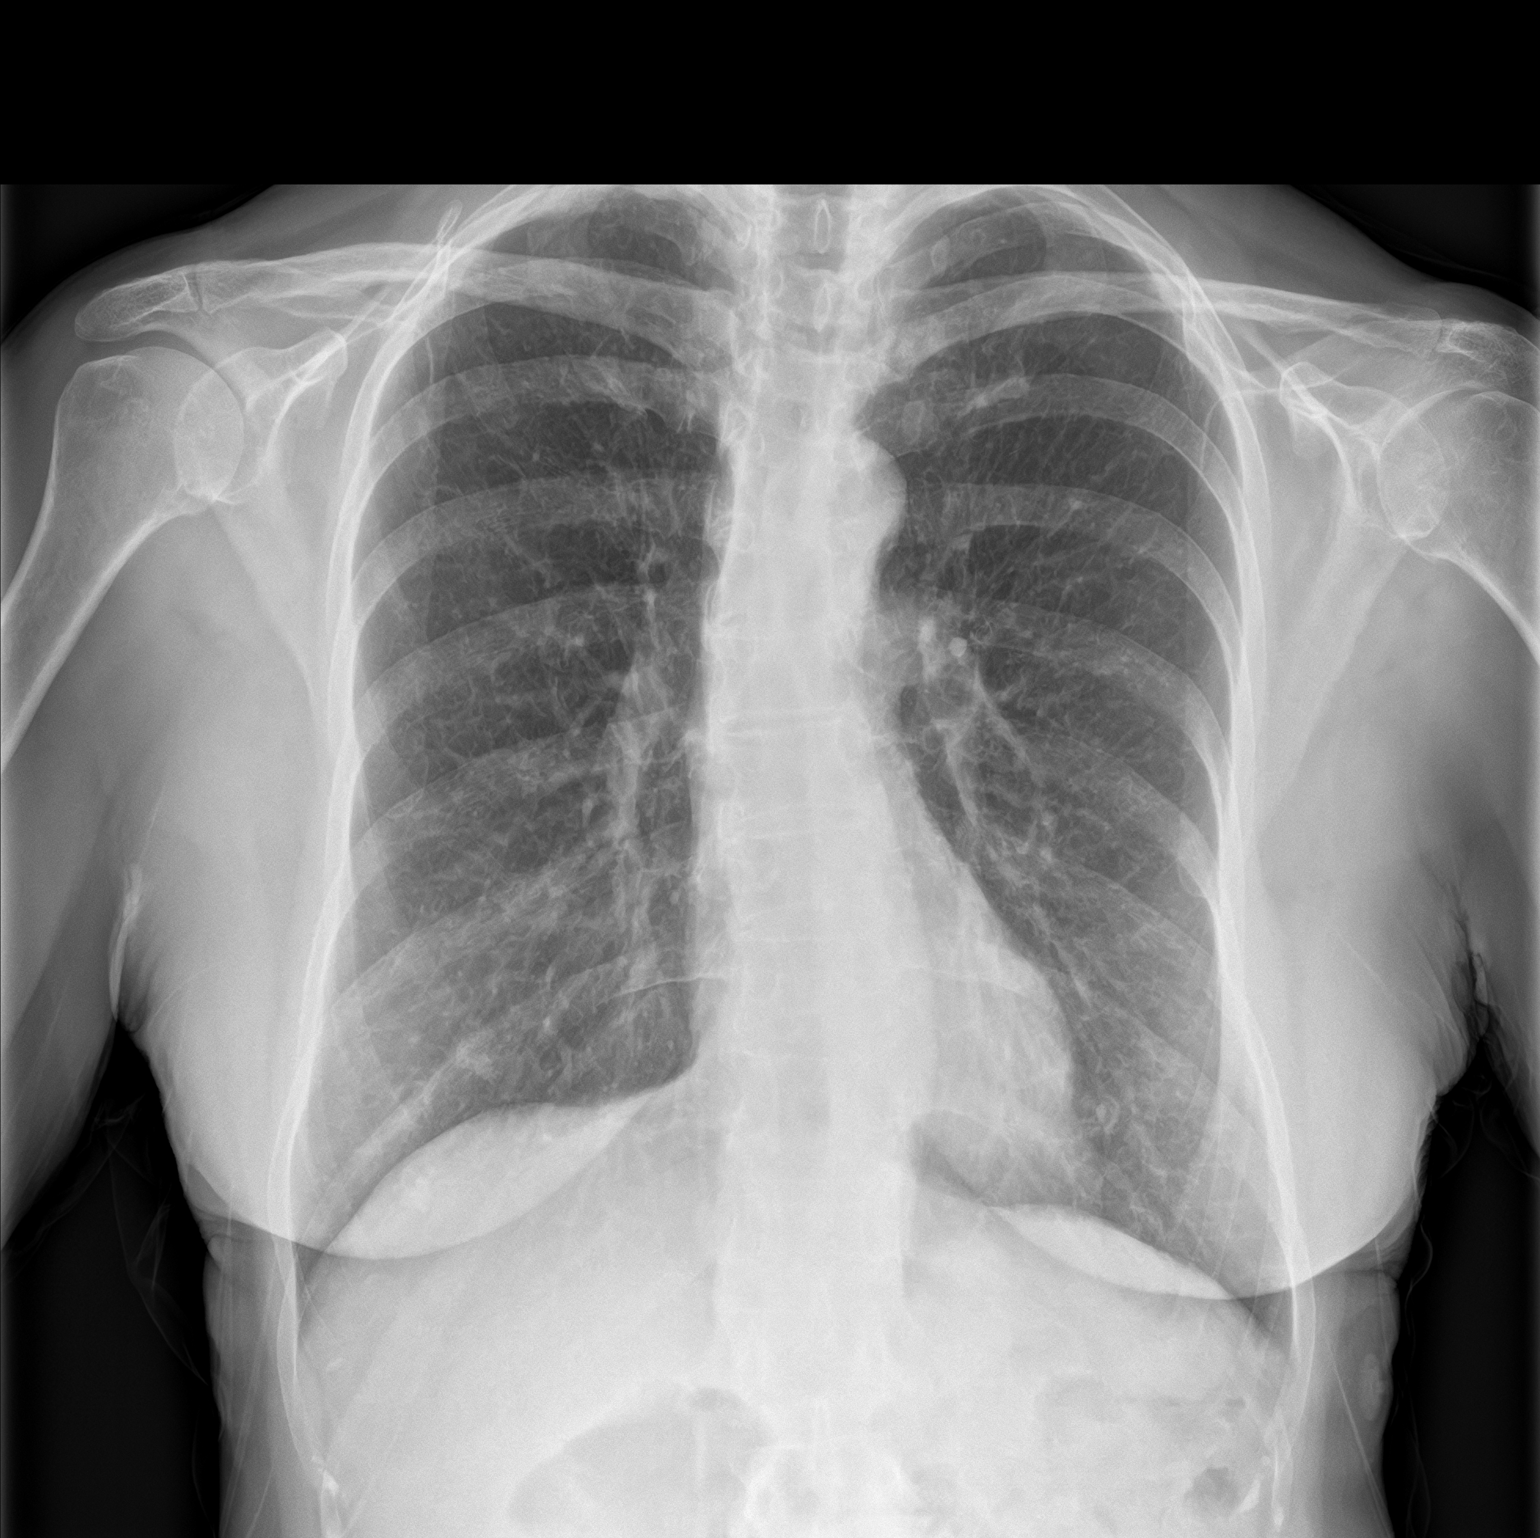
[im 2/2]
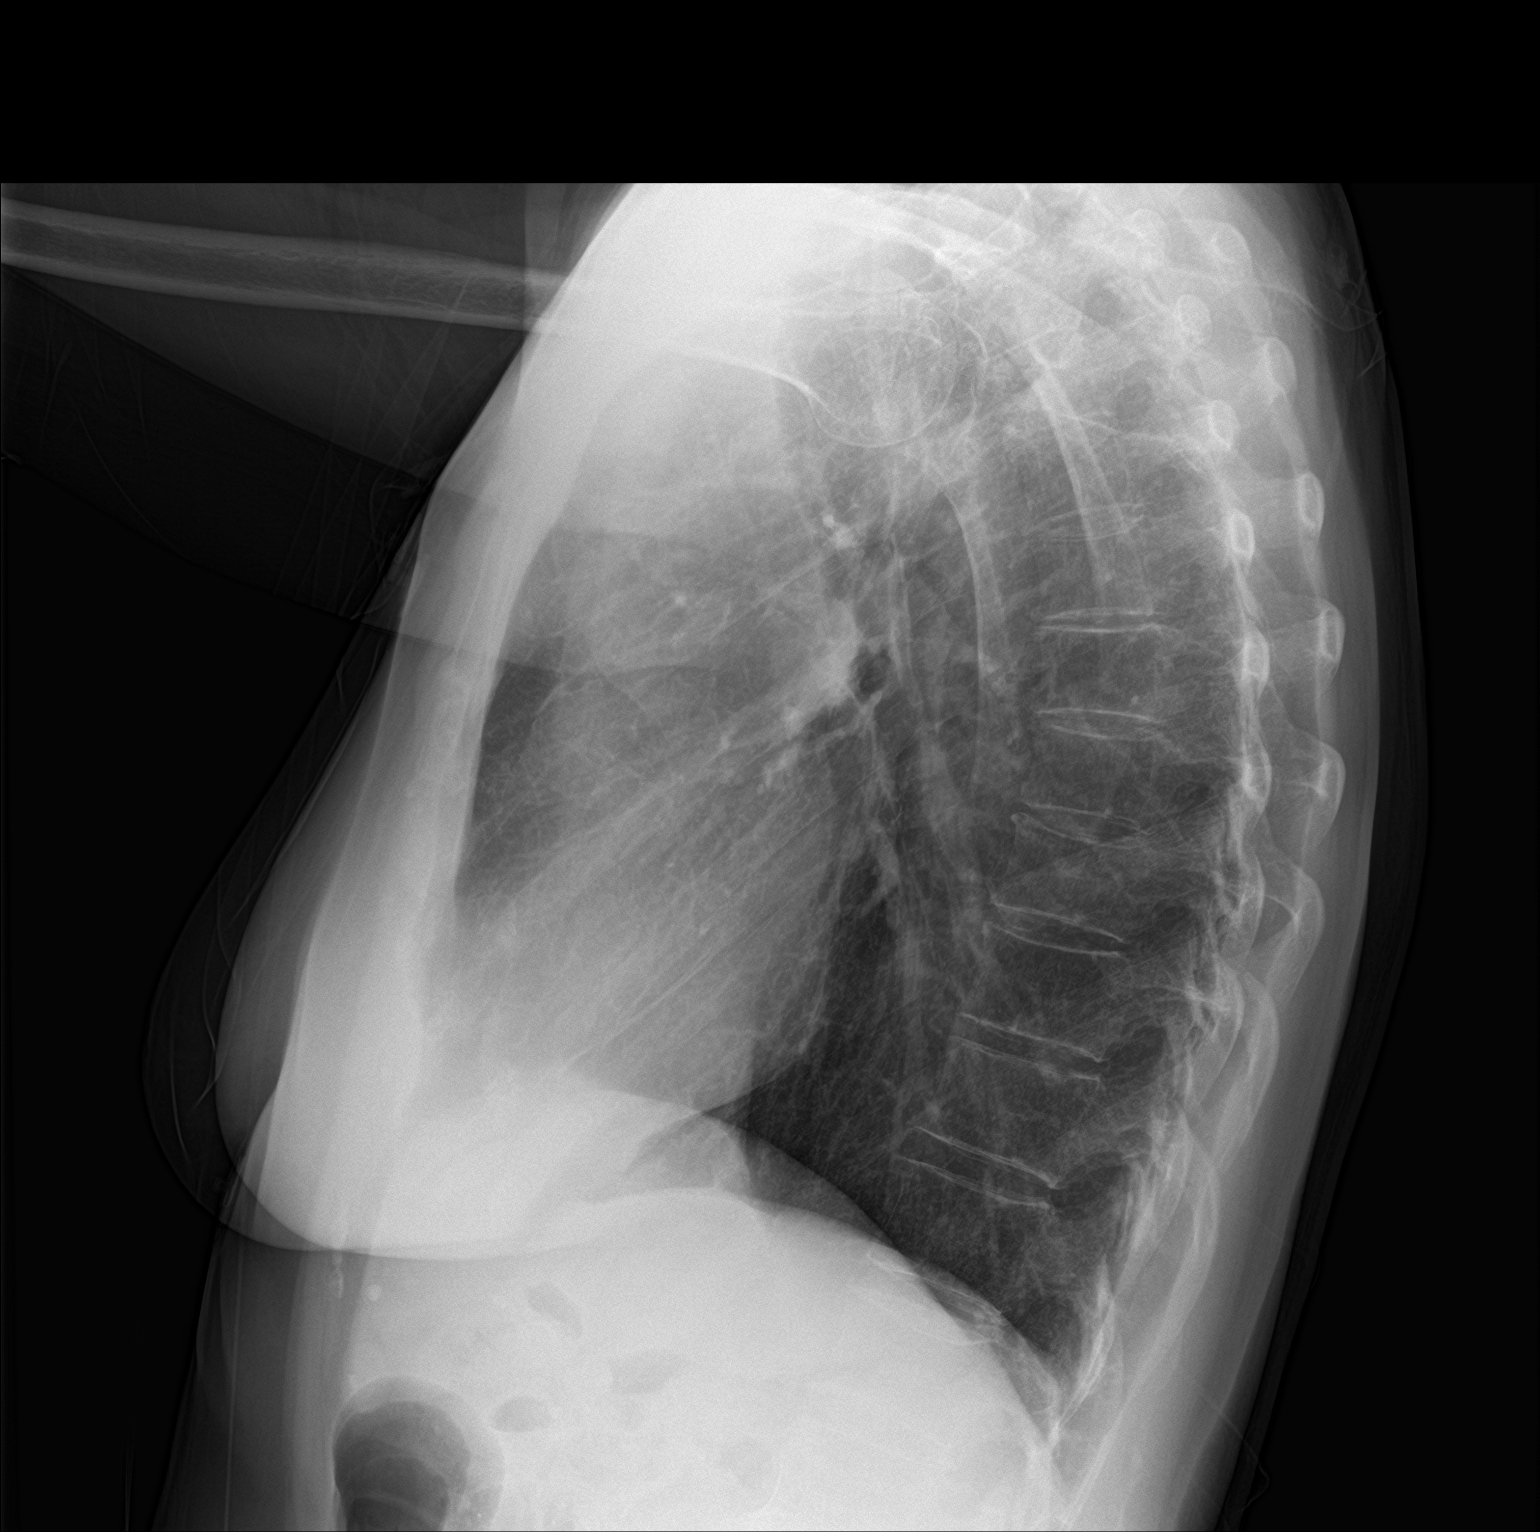

[2 of 2 positions shown; findings below may reference images not displayed]

FINDINGS: The heart size and mediastinal contours are within normal limits.
Both lungs are clear. The visualized skeletal structures are
unremarkable.
IMPRESSION: 1.  No acute abnormality of the lungs.

2. No obvious sternal fracture on chest radiographs, which are not
tailored for evaluation of the sternum.

## 2022-05-16 DIAGNOSIS — F3342 Major depressive disorder, recurrent, in full remission: Secondary | ICD-10-CM | POA: Diagnosis not present

## 2022-06-13 ENCOUNTER — Inpatient Hospital Stay: Payer: Medicare Other

## 2022-06-15 ENCOUNTER — Inpatient Hospital Stay: Payer: Medicare Other | Attending: Nurse Practitioner

## 2022-06-15 DIAGNOSIS — D472 Monoclonal gammopathy: Secondary | ICD-10-CM | POA: Insufficient documentation

## 2022-06-15 DIAGNOSIS — R778 Other specified abnormalities of plasma proteins: Secondary | ICD-10-CM

## 2022-06-15 LAB — CBC WITH DIFFERENTIAL/PLATELET
Abs Immature Granulocytes: 0.02 10*3/uL (ref 0.00–0.07)
Basophils Absolute: 0.1 10*3/uL (ref 0.0–0.1)
Basophils Relative: 1 %
Eosinophils Absolute: 0.4 10*3/uL (ref 0.0–0.5)
Eosinophils Relative: 7 %
HCT: 40.5 % (ref 36.0–46.0)
Hemoglobin: 13.6 g/dL (ref 12.0–15.0)
Immature Granulocytes: 0 %
Lymphocytes Relative: 22 %
Lymphs Abs: 1.3 10*3/uL (ref 0.7–4.0)
MCH: 31.4 pg (ref 26.0–34.0)
MCHC: 33.6 g/dL (ref 30.0–36.0)
MCV: 93.5 fL (ref 80.0–100.0)
Monocytes Absolute: 0.4 10*3/uL (ref 0.1–1.0)
Monocytes Relative: 7 %
Neutro Abs: 3.8 10*3/uL (ref 1.7–7.7)
Neutrophils Relative %: 63 %
Platelets: 239 10*3/uL (ref 150–400)
RBC: 4.33 MIL/uL (ref 3.87–5.11)
RDW: 13.1 % (ref 11.5–15.5)
WBC: 6.1 10*3/uL (ref 4.0–10.5)
nRBC: 0 % (ref 0.0–0.2)

## 2022-06-15 LAB — BASIC METABOLIC PANEL
Anion gap: 11 (ref 5–15)
BUN: 29 mg/dL — ABNORMAL HIGH (ref 8–23)
CO2: 26 mmol/L (ref 22–32)
Calcium: 10 mg/dL (ref 8.9–10.3)
Chloride: 101 mmol/L (ref 98–111)
Creatinine, Ser: 0.84 mg/dL (ref 0.44–1.00)
GFR, Estimated: >60 mL/min (ref >60)
Glucose, Bld: 125 mg/dL — ABNORMAL HIGH (ref 70–99)
Potassium: 4 mmol/L (ref 3.5–5.1)
Sodium: 138 mmol/L (ref 135–145)

## 2022-06-19 ENCOUNTER — Inpatient Hospital Stay: Payer: Medicare Other | Admitting: Nurse Practitioner

## 2022-06-19 ENCOUNTER — Encounter: Payer: Self-pay | Admitting: Nurse Practitioner

## 2022-06-19 DIAGNOSIS — R778 Other specified abnormalities of plasma proteins: Secondary | ICD-10-CM

## 2022-06-19 LAB — KAPPA/LAMBDA LIGHT CHAINS
Kappa free light chain: 28.3 mg/L — ABNORMAL HIGH (ref 3.3–19.4)
Kappa, lambda light chain ratio: 1.65 (ref 0.26–1.65)
Lambda free light chains: 17.2 mg/L (ref 5.7–26.3)

## 2022-06-19 NOTE — Progress Notes (Signed)
[facility]  [phone]) [phone] Fax:([phone]) [phone]  ID: KEALY LEWTER OB: 1945-11-04  MR#: [id number]  KZS#:[id number]  [name], MD as PCP - General (Internal Medicine)  I connected with Marcelle Overlie on 06/19/22 at  3:00 PM EST by video enabled telemedicine visit and verified that I am speaking with the correct person using two identifiers.   I discussed the limitations, risks, security and privacy concerns of performing an evaluation and management service by telemedicine and the availability of in-person appointments. I also discussed with the patient that there may be a patient responsible charge related to this service. The patient expressed understanding and agreed to proceed.   Other persons participating in the visit and their role in the encounter: none  Patient's location: Home. Provider's location: Clinic.  CHIEF COMPLAINT: Elevated kappa chains.  INTERVAL HISTORY: Patient agreed to video assisted telemedicine visit for further evaluation and discussion of her laboratory results.  She continues to feel well and remains asymptomatic. She has no neurologic complaints.  She denies any recent fevers or illnesses.  She has a good appetite and denies weight loss.  She has no chest pain, shortness of breath, cough, or hemoptysis.  She denies any nausea, vomiting, constipation, or diarrhea.  She has no urinary complaints.  Patient offers no specific complaints today. 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  REVIEW OF SYSTEMS:   Review of Systems  Constitutional: Negative.  Negative for fever, malaise/fatigue and weight loss.  Respiratory: Negative.  Negative for cough, hemoptysis and shortness of breath.   Cardiovascular: Negative.  Negative for chest pain and leg swelling.  Gastrointestinal: Negative.  Negative for abdominal  pain.  Genitourinary: Negative.  Negative for dysuria.  Musculoskeletal: Negative.  Negative for back pain.  Skin: Negative.  Negative for rash.  Neurological: Negative.  Negative for dizziness, focal weakness, weakness and headaches.  Psychiatric/Behavioral: Negative.  The patient is not nervous/anxious.   As per HPI. Otherwise, a complete review of systems is negative.   PAST MEDICAL HISTORY: Past Medical History:  Diagnosis Date   Cancer ([place])    skin ca   Depression     PAST SURGICAL HISTORY: Past Surgical History:  Procedure Laterality Date   BREAST BIOPSY Left    neg   BREAST EXCISIONAL BIOPSY Left     FAMILY HISTORY: Family History  Problem Relation Age of Onset   Breast cancer Mother 39   Non-Hodgkin's lymphoma Father    Prostate cancer Brother    Breast cancer Maternal Aunt 95   Breast cancer Maternal Grandmother        mat gm and mat great gm    ADVANCED DIRECTIVES (Y/N):  N   HEALTH MAINTENANCE: Social History   Tobacco Use   Smoking status: Never  Vaping Use   Vaping Use: Never used  Substance Use Topics   Alcohol use: Yes    Alcohol/week: 1.0 standard drink of alcohol    Types: 1 Cans of beer per week    Comment: 1 craft beer daily   Drug use: No     Colonoscopy:  PAP:  Bone density:  Lipid panel:  Allergies  Allergen Reactions   Covid-19 (Adenovirus) Vaccine Cough    Pfizer vaccine  and coughs    Current Outpatient Medications  Medication Sig Dispense Refill   Acetylcysteine 600 MG CAPS Take by mouth.  ascorbic acid (VITAMIN C) 500 MG tablet Take by mouth.     buPROPion (WELLBUTRIN SR) 200 MG 12 hr tablet Take by mouth.     Calcium Carb-Cholecalciferol 500-10 MG-MCG TABS Take 10 mg by mouth in the morning and at bedtime.     cetirizine (ZYRTEC) 10 MG tablet Take by mouth.     Cholecalciferol 50 MCG (2000 UT) CAPS Take 3 capsules daily for 3 months, then reduce to 1 capsule daily thereafter for Vitamin D Deficiency.      fluticasone (FLONASE) 50 MCG/ACT nasal spray Place into the nose.     glucosamine-chondroitin 500-400 MG tablet Take 1 tablet by mouth 3 (three) times daily.     loperamide (IMODIUM) 2 MG capsule Take by mouth.     melatonin 5 MG TABS Take 5 mg by mouth as needed.     Multiple Vitamin (MULTI-VITAMIN) tablet Take 1 tablet by mouth daily.     Probiotic Product (PROBIOTIC-10 PO) Take by mouth.     Quercetin 500 MG CAPS Take 1 capsule by mouth daily.     QUETIAPINE FUMARATE PO Take 6.2 mg by mouth daily at 2 am.     sertraline (ZOLOFT) 50 MG tablet Take 75 mg by mouth. Take 1 and 1/2 tablet daily to equal 75 mg     No current facility-administered medications for this visit.    OBJECTIVE: There were no vitals filed for this visit.    There is no height or weight on file to calculate BMI.     ECOG FS:0 - Asymptomatic  General: Well-developed, well-nourished, no acute distress. HEENT: Normocephalic. Neuro: Alert, answering all questions appropriately. Cranial nerves grossly intact. Psych: Normal affect.  LAB RESULTS:  Lab Results  Component Value Date   NA 138 06/15/2022   K 4.0 06/15/2022   CL 101 06/15/2022   CO2 26 06/15/2022   GLUCOSE 125 (H) 06/15/2022   BUN 29 (H) 06/15/2022   CREATININE 0.84 06/15/2022   CALCIUM 10.0 06/15/2022   PROT 6.9 10/26/2021   ALBUMIN 3.7 01/07/2014   AST 18 09/15/2021   ALT 13 09/15/2021   ALKPHOS 77 01/07/2014   BILITOT 0.4 09/15/2021   GFRNONAA >60 06/15/2022   GFRAA 58 (L) 01/07/2014    Lab Results  Component Value Date   WBC 6.1 06/15/2022   NEUTROABS 3.8 06/15/2022   HGB 13.6 06/15/2022   HCT 40.5 06/15/2022   MCV 93.5 06/15/2022   PLT 239 06/15/2022   STUDIES: No results found.  ASSESSMENT: Elevated kappa chains.  PLAN:   Elevated kappa chains: SPEP, UPEP, and immunoglobulins all either negative or within normal limits.  Previously, she had mildly elevated kappa free light chain of 38.5. Values are still pending at time of  visit but has since resulted and are stable to improved. Her Kappa lambda light chain ratio remains normal. She has mild chronic renal insufficiency, but no other evidence of endorgan damage.  No intervention is needed at this time.  She does not require imaging or bone marrow biopsy at this time.  Return to clinic in 6 months with repeat laboratory work and video assisted telemedicine visit.    Chronic renal insufficiency: Continue monitoring and treatment per nephrology.  Disposition:  6 months- lab (cbc, cmp, kappa lambda light chains, spep) 2 weeks later- see NP or MD for virtual visit- la    I provided 20 minutes of face-to-face video visit time during this encounter which included chart review, counseling, and coordination of care as  documented above.   Patient expressed understanding and was in agreement with this plan. She also understands that She can call clinic at any time with any questions, concerns, or complaints.    Verlon Au, NP   06/19/2022 3:20 PM

## 2022-06-21 ENCOUNTER — Emergency Department (HOSPITAL_COMMUNITY)
Admission: EM | Admit: 2022-06-21 | Discharge: 2022-06-21 | Discharge status: Against Medical Advice | Payer: Medicare Other | Attending: Emergency Medicine | Admitting: Emergency Medicine

## 2022-06-21 ENCOUNTER — Emergency Department (HOSPITAL_COMMUNITY): Payer: Medicare Other

## 2022-06-21 ENCOUNTER — Telehealth: Payer: Self-pay | Admitting: Internal Medicine

## 2022-06-21 ENCOUNTER — Other Ambulatory Visit: Payer: Self-pay

## 2022-06-21 DIAGNOSIS — M25511 Pain in right shoulder: Secondary | ICD-10-CM | POA: Insufficient documentation

## 2022-06-21 DIAGNOSIS — S299XXA Unspecified injury of thorax, initial encounter: Secondary | ICD-10-CM | POA: Diagnosis not present

## 2022-06-21 DIAGNOSIS — M19011 Primary osteoarthritis, right shoulder: Secondary | ICD-10-CM | POA: Diagnosis not present

## 2022-06-21 DIAGNOSIS — J984 Other disorders of lung: Secondary | ICD-10-CM | POA: Diagnosis not present

## 2022-06-21 NOTE — ED Provider Triage Note (Signed)
Emergency Medicine Provider Triage Evaluation Note  Teresa Hogan , a 76 y.o. female  was evaluated in triage.  Pt complains of right posterior shoulder pain this been ongoing for 5 days.  Patient states she has been having to carry her dog around in the right arm because he just had surgery.  There is point tenderness just medial to the right scapula on the right.  Pain is worse with deep inhalation and with rotational movement of the thoracic spine.  She has been taking aspirin which seems to improve her pain.  Pain got severe this morning causing her to take hydrocodone for pain.  This prompted her arrival today.  She denies any fever, cough, leg pain, leg swelling.  Review of Systems  Positive:  Negative: See above   Physical Exam  BP 127/63 (BP Location: Left Arm)   Pulse 69   Temp 97.7 F (36.5 C) (Oral)   Resp 16   Ht '5\' 10"'$  (1.778 m)   Wt 63.5 kg   SpO2 96%   BMI 20.09 kg/m  Gen:   Awake, no distress   Resp:  Normal effort  MSK:   Moves extremities without difficulty  Other:    Medical Decision Making  Medically screening exam initiated at 10:41 AM.  Appropriate orders placed.  Teresa Hogan was informed that the remainder of the evaluation will be completed by another provider, this initial triage assessment does not replace that evaluation, and the importance of remaining in the ED until their evaluation is complete.     Teresa Hogan Teresa Hogan, [place] 06/21/22 1045

## 2022-06-21 NOTE — ED Triage Notes (Signed)
Pt. Stated, Ive had rt. Shoulder pain since last  Friday and it might be because I've been carrying my dog around after a surgery.

## 2022-06-21 NOTE — ED Notes (Signed)
Called for vitals, no answer 

## 2022-06-21 NOTE — Telephone Encounter (Signed)
Spoke with pt and she stated that she is just about at [facility] ED.

## 2022-06-21 NOTE — Telephone Encounter (Signed)
Pt called stating she has back pain, throwing up and sweating. Sent to access nurse

## 2022-09-11 DIAGNOSIS — H2511 Age-related nuclear cataract, right eye: Secondary | ICD-10-CM | POA: Diagnosis not present

## 2022-09-11 DIAGNOSIS — H2513 Age-related nuclear cataract, bilateral: Secondary | ICD-10-CM | POA: Diagnosis not present

## 2022-09-11 DIAGNOSIS — H43392 Other vitreous opacities, left eye: Secondary | ICD-10-CM | POA: Diagnosis not present

## 2022-10-22 DIAGNOSIS — H2513 Age-related nuclear cataract, bilateral: Secondary | ICD-10-CM | POA: Diagnosis not present

## 2022-10-22 DIAGNOSIS — H2511 Age-related nuclear cataract, right eye: Secondary | ICD-10-CM | POA: Diagnosis not present

## 2022-10-25 ENCOUNTER — Encounter: Payer: Self-pay | Admitting: Ophthalmology

## 2022-10-26 NOTE — Anesthesia Preprocedure Evaluation (Signed)
Anesthesia Evaluation  Patient identified by MRN, date of birth, ID band Patient awake    Reviewed: Allergy & Precautions, H&P , NPO status , Patient's Chart, lab work & pertinent test results  History of Anesthesia Complications (+) PONV and history of anesthetic complications  Airway Mallampati: II  TM Distance: <3 FB Neck ROM: Full    Dental no notable dental hx.    Pulmonary neg pulmonary ROS   Pulmonary exam normal breath sounds clear to auscultation       Cardiovascular negative cardio ROS Normal cardiovascular exam Rhythm:Regular Rate:Normal     Neuro/Psych  PSYCHIATRIC DISORDERS  Depression    negative neurological ROS  negative psych ROS   GI/Hepatic negative GI ROS, Neg liver ROS,,,  Endo/Other  negative endocrine ROS    Renal/GU CRFRenal diseasenegative Renal ROSCKD stage III per Epic  negative genitourinary   Musculoskeletal negative musculoskeletal ROS (+)    Abdominal   Peds negative pediatric ROS (+)  Hematology negative hematology ROS (+)   Anesthesia Other Findings Depression  Cancer (HCC) Motion sickness  PONV (postoperative nausea and vomiting)    Reproductive/Obstetrics negative OB ROS                             Anesthesia Physical Anesthesia Plan  ASA: 2  Anesthesia Plan: MAC   Post-op Pain Management:    Induction: Intravenous  PONV Risk Score and Plan:   Airway Management Planned: Natural Airway and Nasal Cannula  Additional Equipment:   Intra-op Plan:   Post-operative Plan:   Informed Consent: I have reviewed the patients History and Physical, chart, labs and discussed the procedure including the risks, benefits and alternatives for the proposed anesthesia with the patient or authorized representative who has indicated his/her understanding and acceptance.     Dental Advisory Given  Plan Discussed with: Anesthesiologist, CRNA and  Surgeon  Anesthesia Plan Comments: (Patient consented for risks of anesthesia including but not limited to:  - adverse reactions to medications - damage to eyes, teeth, lips or other oral mucosa - nerve damage due to positioning  - sore throat or hoarseness - Damage to heart, brain, nerves, lungs, other parts of body or loss of life  Patient voiced understanding.)        Anesthesia Quick Evaluation

## 2022-10-30 NOTE — Discharge Instructions (Signed)

## 2022-11-01 ENCOUNTER — Ambulatory Visit: Payer: Medicare Other | Admitting: Anesthesiology

## 2022-11-01 ENCOUNTER — Encounter
Admission: RE | Disposition: A | Discharge status: Home / Self Care | Payer: Self-pay | Source: Ambulatory Visit | Attending: Ophthalmology

## 2022-11-01 ENCOUNTER — Encounter: Payer: Self-pay | Admitting: Ophthalmology

## 2022-11-01 ENCOUNTER — Other Ambulatory Visit: Payer: Self-pay

## 2022-11-01 ENCOUNTER — Ambulatory Visit
Admission: RE | Admit: 2022-11-01 | Discharge: 2022-11-01 | Disposition: A | Discharge status: Home / Self Care | Payer: Medicare Other | Source: Ambulatory Visit | Attending: Ophthalmology | Admitting: Ophthalmology

## 2022-11-01 DIAGNOSIS — F32A Depression, unspecified: Secondary | ICD-10-CM | POA: Diagnosis not present

## 2022-11-01 DIAGNOSIS — F339 Major depressive disorder, recurrent, unspecified: Secondary | ICD-10-CM | POA: Diagnosis not present

## 2022-11-01 DIAGNOSIS — H269 Unspecified cataract: Secondary | ICD-10-CM | POA: Diagnosis not present

## 2022-11-01 DIAGNOSIS — H2511 Age-related nuclear cataract, right eye: Secondary | ICD-10-CM | POA: Insufficient documentation

## 2022-11-01 HISTORY — PX: CATARACT EXTRACTION W/PHACO: SHX586

## 2022-11-01 HISTORY — DX: Motion sickness, initial encounter: T75.3XXA

## 2022-11-01 HISTORY — DX: Nausea with vomiting, unspecified: Z98.890

## 2022-11-01 HISTORY — DX: Other specified postprocedural states: R11.2

## 2022-11-01 SURGERY — PHACOEMULSIFICATION, CATARACT, WITH IOL INSERTION
Anesthesia: Monitor Anesthesia Care | Site: Eye | Laterality: Right

## 2022-11-01 MED ORDER — BRIMONIDINE TARTRATE-TIMOLOL 0.2-0.5 % OP SOLN
OPHTHALMIC | Status: DC | PRN
  Administered 2022-11-01: 1 [drp] via OPHTHALMIC

## 2022-11-01 MED ORDER — ONDANSETRON HCL 4 MG/2ML IJ SOLN
INTRAMUSCULAR | Status: DC | PRN
  Administered 2022-11-01: 4 mg via INTRAVENOUS

## 2022-11-01 MED ORDER — SIGHTPATH DOSE#1 BSS IO SOLN
INTRAOCULAR | Status: DC | PRN
  Administered 2022-11-01: 15 mL

## 2022-11-01 MED ORDER — FENTANYL CITRATE (PF) 100 MCG/2ML IJ SOLN
INTRAMUSCULAR | Status: DC | PRN
  Administered 2022-11-01: 25 ug via INTRAVENOUS

## 2022-11-01 MED ORDER — MIDAZOLAM HCL 2 MG/2ML IJ SOLN
INTRAMUSCULAR | Status: DC | PRN
  Administered 2022-11-01 (×2): 1 mg via INTRAVENOUS

## 2022-11-01 MED ORDER — LACTATED RINGERS IV SOLN: INTRAVENOUS | Status: DC

## 2022-11-01 MED ORDER — SODIUM CHLORIDE 0.9% FLUSH
INTRAVENOUS | Status: DC | PRN
  Administered 2022-11-01 (×2): 10 mL via INTRAVENOUS

## 2022-11-01 MED ORDER — TETRACAINE HCL 0.5 % OP SOLN
1.0000 [drp] | OPHTHALMIC | Status: DC | PRN
  Administered 2022-11-01 (×3): 1 [drp] via OPHTHALMIC

## 2022-11-01 MED ORDER — GLYCOPYRROLATE 0.2 MG/ML IJ SOLN
INTRAMUSCULAR | Status: DC | PRN
  Administered 2022-11-01: .1 mg via INTRAVENOUS

## 2022-11-01 MED ORDER — TETRACAINE HCL 0.5 % OP SOLN
OPHTHALMIC | Status: DC | PRN
  Administered 2022-11-01: 1 [drp] via OPHTHALMIC

## 2022-11-01 MED ORDER — ARMC OPHTHALMIC DILATING DROPS
1.0000 | OPHTHALMIC | Status: DC | PRN
  Administered 2022-11-01 (×3): 1 via OPHTHALMIC

## 2022-11-01 MED ORDER — SIGHTPATH DOSE#1 NA HYALUR & NA CHOND-NA HYALUR IO KIT
PACK | INTRAOCULAR | Status: DC | PRN
  Administered 2022-11-01: 1 via OPHTHALMIC

## 2022-11-01 MED ORDER — SIGHTPATH DOSE#1 BSS IO SOLN
INTRAOCULAR | Status: DC | PRN
  Administered 2022-11-01: 81 mL via OPHTHALMIC

## 2022-11-01 MED ORDER — MOXIFLOXACIN HCL 0.5 % OP SOLN
OPHTHALMIC | Status: DC | PRN
  Administered 2022-11-01: .2 mL via OPHTHALMIC

## 2022-11-01 MED ORDER — LIDOCAINE HCL (PF) 2 % IJ SOLN
INTRAOCULAR | Status: DC | PRN
  Administered 2022-11-01: 1 mL via INTRAOCULAR

## 2022-11-01 SURGICAL SUPPLY — 11 items
CATARACT SUITE SIGHTPATH (MISCELLANEOUS) ×1 IMPLANT
DISSECTOR HYDRO NUCLEUS 50X22 (MISCELLANEOUS) ×1 IMPLANT
DRSG TEGADERM 2-3/8X2-3/4 SM (GAUZE/BANDAGES/DRESSINGS) ×1 IMPLANT
FEE CATARACT SUITE SIGHTPATH (MISCELLANEOUS) ×1 IMPLANT
GLOVE SURG SYN 7.5  E (GLOVE) ×1
GLOVE SURG SYN 7.5 E (GLOVE) ×1 IMPLANT
GLOVE SURG SYN 7.5 PF PI (GLOVE) ×1 IMPLANT
GLOVE SURG SYN 8.5  E (GLOVE) ×1
GLOVE SURG SYN 8.5 E (GLOVE) ×1 IMPLANT
GLOVE SURG SYN 8.5 PF PI (GLOVE) ×1 IMPLANT
LENS IOL TECNIS EYHANCE 17.5 (Intraocular Lens) IMPLANT

## 2022-11-01 NOTE — H&P (Signed)
[facility]   Primary Care Physician:  Sherlene Shams, MD Ophthalmologist: Dr. Deberah Pelton  Pre-Procedure History & Physical: HPI:  Teresa Hogan is a 78 y.o. female here for cataract surgery.   Past Medical History:  Diagnosis Date   Cancer ([facility])    skin ca   Depression    Motion sickness    PONV (postoperative nausea and vomiting)    Nausea after breast biopsy many years ago    Past Surgical History:  Procedure Laterality Date   BREAST BIOPSY Left    neg   BREAST EXCISIONAL BIOPSY Left     Prior to Admission medications   Medication Sig Start Date End Date Taking? Authorizing Provider  Acetylcysteine 600 MG CAPS Take by mouth.   Yes [provider]  ascorbic acid (VITAMIN C) 500 MG tablet Take by mouth.   Yes [provider]  buPROPion (WELLBUTRIN SR) 200 MG 12 hr tablet Take by mouth. 09/02/19  Yes [provider]  Calcium Carb-Cholecalciferol 500-10 MG-MCG TABS Take 10 mg by mouth in the morning and at bedtime.   Yes [provider]  cetirizine (ZYRTEC) 10 MG tablet Take by mouth.   Yes [provider]  Cholecalciferol 50 MCG (2000 UT) CAPS Take 3 capsules daily for 3 months, then reduce to 1 capsule daily thereafter for Vitamin D Deficiency. 05/12/19  Yes [provider]  fluticasone (FLONASE) 50 MCG/ACT nasal spray Place into the nose.   Yes [provider]  glucosamine-chondroitin 500-400 MG tablet Take 1 tablet by mouth 3 (three) times daily.   Yes [provider]  melatonin 5 MG TABS Take 5 mg by mouth as needed.   Yes [provider]  Multiple Vitamin (MULTI-VITAMIN) tablet Take 1 tablet by mouth daily.   Yes [provider]  Quercetin 500 MG CAPS Take 1 capsule by mouth daily.   Yes [provider]  QUETIAPINE FUMARATE PO Take 6.2 mg by mouth daily at 2 am.   Yes [provider]  sertraline (ZOLOFT) 50 MG tablet Take 75 mg by mouth. Take 1 and  1/2 tablet daily to equal 75 mg   Yes [provider]  loperamide (IMODIUM) 2 MG capsule Take by mouth.    [provider]  Probiotic Product (PROBIOTIC-10 PO) Take by mouth. Patient not taking: Reported on 10/25/2022    [provider]    Allergies as of 09/19/2022 - Review Complete 06/21/2022  Allergen Reaction Noted   Covid-19 (adenovirus) vaccine Cough 09/15/2021    Family History  Problem Relation Age of Onset   Breast cancer Mother 24   Non-Hodgkin's lymphoma Father    Prostate cancer Brother    Breast cancer Maternal Aunt 40   Breast cancer Maternal Grandmother        mat gm and mat great gm    Social History   Socioeconomic History   Marital status: Married    Spouse name: Not on file   Number of children: Not on file   Years of education: Not on file   Highest education level: Not on file  Occupational History   Not on file  Tobacco Use   Smoking status: Never   Smokeless tobacco: Never  Vaping Use   Vaping Use: Never used  Substance and Sexual Activity   Alcohol use: Yes    Alcohol/week: 7.0 standard drinks of alcohol    Types: 7 Cans of beer per week    Comment: 1 craft  beer daily   Drug use: No   Sexual activity: Not on file  Other Topics Concern   Not on file  Social History Narrative   Not on file   Social Determinants of Health   Financial Resource Strain: Low Risk  (12/26/2021)   Overall Financial Resource Strain (CARDIA)    Difficulty of Paying Living Expenses: Not hard at all  Food Insecurity: No Food Insecurity (12/26/2021)   Hunger Vital Sign    Worried About Running Out of Food in the Last Year: Never true    Ran Out of Food in the Last Year: Never true  Transportation Needs: No Transportation Needs (12/26/2021)   PRAPARE - Administrator, Civil Service (Medical): No    Lack of Transportation (Non-Medical): No  Physical Activity: Insufficiently Active (12/26/2021)   Exercise Vital Sign    Days of  Exercise per Week: 7 days    Minutes of Exercise per Session: 20 min  Stress: No Stress Concern Present (12/26/2021)   [employer] of Occupational Health - Occupational Stress Questionnaire    Feeling of Stress : Not at all  Social Connections: Unknown (12/26/2021)   Social Connection and Isolation Panel [NHANES]    Frequency of Communication with Friends and Family: Not on file    Frequency of Social Gatherings with Friends and Family: Not on file    Attends Religious Services: Not on file    Active Member of Clubs or Organizations: Not on file    Attends Banker Meetings: Not on file    Marital Status: Married  Intimate Partner Violence: Not At Risk (12/26/2021)   Humiliation, Afraid, Rape, and Kick questionnaire    Fear of Current or Ex-Partner: No    Emotionally Abused: No    Physically Abused: No    Sexually Abused: No    Review of Systems: See HPI, otherwise negative ROS  Physical Exam: Ht 5\' 10"  (1.778 m)   Wt 65.8 kg   BMI 20.81 kg/m  General:   Alert, cooperative in NAD Head:  Normocephalic and atraumatic. Respiratory:  Normal work of breathing. Cardiovascular:  RRR  Impression/Plan: Teresa Hogan is here for cataract surgery.  Risks, benefits, limitations, and alternatives regarding cataract surgery have been reviewed with the patient.  Questions have been answered.  All parties agreeable.   Estanislado Pandy, MD  11/01/2022, 11:09 AM

## 2022-11-01 NOTE — Op Note (Signed)
OPERATIVE NOTE  Teresa Hogan [id number] 11/01/2022   PREOPERATIVE DIAGNOSIS: Nuclear sclerotic cataract right eye. H25.11   POSTOPERATIVE DIAGNOSIS: Nuclear sclerotic cataract right eye. H25.11   PROCEDURE:  Phacoemusification with posterior chamber intraocular lens placement of the right eye  Ultrasound time: Procedure(s): CATARACT EXTRACTION PHACO AND INTRAOCULAR LENS PLACEMENT (IOC) RIGHT  13.28  01:18.9 (Right)  LENS:   Implant Name Type Inv. Item Serial No. Manufacturer Lot No. LRB No. Used Action  LENS IOL TECNIS EYHANCE 17.5 - [id number] Intraocular Lens LENS IOL TECNIS EYHANCE 17.5 [id number] [employer]  Right 1 Implanted      SURGEON:  Julious Payer. Rolley Sims, MD   ANESTHESIA:  Topical with tetracaine drops, augmented with 1% preservative-free intracameral lidocaine.   COMPLICATIONS:  None.   DESCRIPTION OF PROCEDURE:  The patient was identified in the holding room and transported to the operating room and placed in the supine position under the operating microscope.  The right eye was identified as the operative eye, which was prepped and draped in the usual sterile ophthalmic fashion.   A 1 millimeter clear-corneal paracentesis was made superotemporally. Preservative-free 1% lidocaine mixed with 1:1,000 bisulfite-free aqueous solution of epinephrine was injected into the anterior chamber. The anterior chamber was then filled with Viscoat viscoelastic. A 2.4 millimeter keratome was used to make a clear-corneal incision inferotemporally. A curvilinear capsulorrhexis was made with a cystotome and capsulorrhexis forceps. Balanced salt solution was used to hydrodissect and hydrodelineate the nucleus. Phacoemulsification was then used to remove the lens nucleus and epinucleus. The remaining cortex was then removed using the irrigation and aspiration handpiece. Provisc was then placed into the capsular bag to distend it for lens placement. A +17.50 D DIB00 intraocular lens was then  injected into the capsular bag. The remaining viscoelastic was aspirated.   Wounds were hydrated with balanced salt solution.  The anterior chamber was inflated to a physiologic pressure with balanced salt solution.  No wound leaks were noted. Ancef was injected intracamerally.  Timolol and Brimonidine drops were applied to the eye.  The patient was taken to the recovery room in stable condition without complications of anesthesia or surgery.  [name] 11/01/2022, 1:39 PM

## 2022-11-01 NOTE — Anesthesia Postprocedure Evaluation (Signed)
Anesthesia Post Note  Patient: Teresa Hogan  Procedure(s) Performed: CATARACT EXTRACTION PHACO AND INTRAOCULAR LENS PLACEMENT (IOC) RIGHT  13.28  01:18.9 (Right: Eye)  Patient location during evaluation: PACU Anesthesia Type: MAC Level of consciousness: awake and alert Pain management: pain level controlled Vital Signs Assessment: post-procedure vital signs reviewed and stable Respiratory status: spontaneous breathing, nonlabored ventilation, respiratory function stable and patient connected to nasal cannula oxygen Cardiovascular status: stable and blood pressure returned to baseline Postop Assessment: no apparent nausea or vomiting Anesthetic complications: no   No notable events documented.   Last Vitals:  Vitals:   11/01/22 1340 11/01/22 1345  BP: (!) 111/58 (!) 110/57  Pulse: 64 75  Resp: 18 13  Temp: (!) 36.2 C (!) 36.4 C  SpO2: 98% 100%    Last Pain:  Vitals:   11/01/22 1345  TempSrc:   PainSc: 0-No pain                 Teresa Hogan

## 2022-11-01 NOTE — Transfer of Care (Signed)
Immediate Anesthesia Transfer of Care Note  Patient: Teresa Hogan  Procedure(s) Performed: CATARACT EXTRACTION PHACO AND INTRAOCULAR LENS PLACEMENT (IOC) RIGHT  13.28  01:18.9 (Right: Eye)  Patient Location: PACU  Anesthesia Type:MAC  Level of Consciousness: awake, alert , and oriented  Airway & Oxygen Therapy: Patient Spontanous Breathing  Post-op Assessment: Report given to RN and Post -op Vital signs reviewed and stable  Post vital signs: Reviewed and stable  Last Vitals:  Vitals Value Taken Time  BP 111/58 11/01/22 1341  Temp 36.2 C 11/01/22 1340  Pulse 76 11/01/22 1343  Resp 16 11/01/22 1343  SpO2 97 % 11/01/22 1343  Vitals shown include unvalidated device data.  Last Pain:  Vitals:   11/01/22 1212  TempSrc: Temporal  PainSc: 0-No pain         Complications: No notable events documented.

## 2022-11-05 ENCOUNTER — Encounter: Payer: Self-pay | Admitting: Ophthalmology

## 2022-11-09 DIAGNOSIS — H1132 Conjunctival hemorrhage, left eye: Secondary | ICD-10-CM | POA: Diagnosis not present

## 2022-11-09 DIAGNOSIS — H15102 Unspecified episcleritis, left eye: Secondary | ICD-10-CM | POA: Diagnosis not present

## 2022-11-19 DIAGNOSIS — F3342 Major depressive disorder, recurrent, in full remission: Secondary | ICD-10-CM | POA: Diagnosis not present

## 2022-12-14 ENCOUNTER — Encounter: Payer: Self-pay | Admitting: Ophthalmology

## 2022-12-14 NOTE — Anesthesia Preprocedure Evaluation (Addendum)
Anesthesia Evaluation  Patient identified by MRN, date of birth, ID band Patient awake    Reviewed: Allergy & Precautions, H&P , NPO status , Patient's Chart, lab work & pertinent test results  History of Anesthesia Complications (+) PONV and history of anesthetic complications  Airway Mallampati: II  TM Distance: <3 FB Neck ROM: Full    Dental no notable dental hx.    Pulmonary neg pulmonary ROS   Pulmonary exam normal breath sounds clear to auscultation       Cardiovascular negative cardio ROS Normal cardiovascular exam Rhythm:Regular Rate:Normal     Neuro/Psych  PSYCHIATRIC DISORDERS  Depression    negative neurological ROS  negative psych ROS   GI/Hepatic negative GI ROS, Neg liver ROS,,,  Endo/Other  negative endocrine ROS    Renal/GU Renal diseasenegative Renal ROS  negative genitourinary   Musculoskeletal negative musculoskeletal ROS (+)    Abdominal   Peds negative pediatric ROS (+)  Hematology negative hematology ROS (+)   Anesthesia Other Findings Depression  Cancer (HCC) Motion sickness  PONV (postoperative nausea and vomiting)  Previous cataract surgery May 2024  Reproductive/Obstetrics negative OB ROS                              Anesthesia Physical Anesthesia Plan  ASA: 2  Anesthesia Plan: MAC   Post-op Pain Management:    Induction: Intravenous  PONV Risk Score and Plan:   Airway Management Planned: Natural Airway and Nasal Cannula  Additional Equipment:   Intra-op Plan:   Post-operative Plan:   Informed Consent: I have reviewed the patients History and Physical, chart, labs and discussed the procedure including the risks, benefits and alternatives for the proposed anesthesia with the patient or authorized representative who has indicated his/her understanding and acceptance.     Dental Advisory Given  Plan Discussed with: Anesthesiologist, CRNA  and Surgeon  Anesthesia Plan Comments: (Patient consented for risks of anesthesia including but not limited to:  - adverse reactions to medications - damage to eyes, teeth, lips or other oral mucosa - nerve damage due to positioning  - sore throat or hoarseness - Damage to heart, brain, nerves, lungs, other parts of body or loss of life  Patient voiced understanding.)         Anesthesia Quick Evaluation

## 2022-12-18 NOTE — Discharge Instructions (Signed)

## 2022-12-20 ENCOUNTER — Ambulatory Visit
Admission: RE | Admit: 2022-12-20 | Discharge: 2022-12-20 | Disposition: A | Discharge status: Home / Self Care | Payer: Medicare Other | Source: Ambulatory Visit | Attending: Ophthalmology | Admitting: Ophthalmology

## 2022-12-20 ENCOUNTER — Other Ambulatory Visit: Payer: Self-pay

## 2022-12-20 ENCOUNTER — Encounter
Admission: RE | Disposition: A | Discharge status: Home / Self Care | Payer: Self-pay | Source: Ambulatory Visit | Attending: Ophthalmology

## 2022-12-20 ENCOUNTER — Ambulatory Visit: Payer: Medicare Other | Admitting: Anesthesiology

## 2022-12-20 DIAGNOSIS — N183 Chronic kidney disease, stage 3 unspecified: Secondary | ICD-10-CM | POA: Diagnosis not present

## 2022-12-20 DIAGNOSIS — H269 Unspecified cataract: Secondary | ICD-10-CM | POA: Diagnosis not present

## 2022-12-20 DIAGNOSIS — F32A Depression, unspecified: Secondary | ICD-10-CM | POA: Insufficient documentation

## 2022-12-20 DIAGNOSIS — H2512 Age-related nuclear cataract, left eye: Secondary | ICD-10-CM | POA: Insufficient documentation

## 2022-12-20 DIAGNOSIS — H2511 Age-related nuclear cataract, right eye: Secondary | ICD-10-CM | POA: Diagnosis not present

## 2022-12-20 HISTORY — PX: CATARACT EXTRACTION W/PHACO: SHX586

## 2022-12-20 SURGERY — PHACOEMULSIFICATION, CATARACT, WITH IOL INSERTION
Anesthesia: Monitor Anesthesia Care | Site: Eye | Laterality: Left

## 2022-12-20 MED ORDER — MIDAZOLAM HCL 2 MG/2ML IJ SOLN
INTRAMUSCULAR | Status: DC | PRN
  Administered 2022-12-20 (×2): 1 mg via INTRAVENOUS

## 2022-12-20 MED ORDER — BRIMONIDINE TARTRATE-TIMOLOL 0.2-0.5 % OP SOLN
OPHTHALMIC | Status: DC | PRN
  Administered 2022-12-20: 1 [drp] via OPHTHALMIC

## 2022-12-20 MED ORDER — TETRACAINE HCL 0.5 % OP SOLN
OPHTHALMIC | Status: DC | PRN
  Administered 2022-12-20: 1 [drp] via OPHTHALMIC

## 2022-12-20 MED ORDER — ARMC OPHTHALMIC DILATING DROPS
1.0000 | OPHTHALMIC | Status: DC | PRN
  Administered 2022-12-20 (×3): 1 via OPHTHALMIC

## 2022-12-20 MED ORDER — SIGHTPATH DOSE#1 NA HYALUR & NA CHOND-NA HYALUR IO KIT
PACK | INTRAOCULAR | Status: DC | PRN
  Administered 2022-12-20: 1 via OPHTHALMIC

## 2022-12-20 MED ORDER — FENTANYL CITRATE (PF) 100 MCG/2ML IJ SOLN
INTRAMUSCULAR | Status: DC | PRN
  Administered 2022-12-20 (×2): 50 ug via INTRAVENOUS

## 2022-12-20 MED ORDER — LIDOCAINE HCL (PF) 2 % IJ SOLN
INTRAOCULAR | Status: DC | PRN
  Administered 2022-12-20: 1 mL via INTRAOCULAR

## 2022-12-20 MED ORDER — TETRACAINE HCL 0.5 % OP SOLN
1.0000 [drp] | OPHTHALMIC | Status: DC | PRN
  Administered 2022-12-20 (×3): 1 [drp] via OPHTHALMIC

## 2022-12-20 MED ORDER — SIGHTPATH DOSE#1 BSS IO SOLN
INTRAOCULAR | Status: DC | PRN
  Administered 2022-12-20: 15 mL

## 2022-12-20 MED ORDER — LACTATED RINGERS IV SOLN: INTRAVENOUS | Status: DC

## 2022-12-20 MED ORDER — SIGHTPATH DOSE#1 BSS IO SOLN
INTRAOCULAR | Status: DC | PRN
  Administered 2022-12-20: 61 mL via OPHTHALMIC

## 2022-12-20 MED ORDER — MOXIFLOXACIN HCL 0.5 % OP SOLN
OPHTHALMIC | Status: DC | PRN
  Administered 2022-12-20: .2 mL via OPHTHALMIC

## 2022-12-20 SURGICAL SUPPLY — 9 items
CATARACT SUITE SIGHTPATH (MISCELLANEOUS) ×1 IMPLANT
DISSECTOR HYDRO NUCLEUS 50X22 (MISCELLANEOUS) ×1 IMPLANT
DRSG TEGADERM 2-3/8X2-3/4 SM (GAUZE/BANDAGES/DRESSINGS) ×1 IMPLANT
FEE CATARACT SUITE SIGHTPATH (MISCELLANEOUS) ×1 IMPLANT
GLOVE SURG SYN 7.5 E (GLOVE) ×1 IMPLANT
GLOVE SURG SYN 7.5 PF PI (GLOVE) ×1 IMPLANT
GLOVE SURG SYN 8.5 E (GLOVE) ×1 IMPLANT
GLOVE SURG SYN 8.5 PF PI (GLOVE) ×1 IMPLANT
LENS IOL TECNIS EYHANCE 18.5 (Intraocular Lens) IMPLANT

## 2022-12-20 NOTE — H&P (Signed)
[facility]   Primary Care Physician:  Sherlene Shams, MD Ophthalmologist: Dr. Deberah Pelton  Pre-Procedure History & Physical: HPI:  Teresa Hogan is a 77 y.o. female here for cataract surgery.   Past Medical History:  Diagnosis Date   Cancer (HCC)    skin ca   Depression    Motion sickness    PONV (postoperative nausea and vomiting)    Nausea after breast biopsy many years ago    Past Surgical History:  Procedure Laterality Date   BREAST BIOPSY Left    neg   BREAST EXCISIONAL BIOPSY Left    CATARACT EXTRACTION W/PHACO Right 11/01/2022   Procedure: CATARACT EXTRACTION PHACO AND INTRAOCULAR LENS PLACEMENT (IOC) RIGHT  13.28  01:18.9;  Surgeon: Estanislado Pandy, MD;  Location: [facility];  Service: Ophthalmology;  Laterality: Right;    Prior to Admission medications   Medication Sig Start Date End Date Taking? Authorizing Provider  Acetylcysteine 600 MG CAPS Take by mouth.    [provider]  ascorbic acid (VITAMIN C) 500 MG tablet Take by mouth.    [provider]  buPROPion (WELLBUTRIN SR) 200 MG 12 hr tablet Take by mouth. 09/02/19   [provider]  Calcium Carb-Cholecalciferol 500-10 MG-MCG TABS Take 10 mg by mouth in the morning and at bedtime.    [provider]  cetirizine (ZYRTEC) 10 MG tablet Take by mouth.    [provider]  Cholecalciferol 50 MCG (2000 UT) CAPS Take 3 capsules daily for 3 months, then reduce to 1 capsule daily thereafter for Vitamin D Deficiency. 05/12/19   [provider]  fluticasone (FLONASE) 50 MCG/ACT nasal spray Place into the nose.    [provider]  glucosamine-chondroitin 500-400 MG tablet Take 1 tablet by mouth 3 (three) times daily.    [provider]  loperamide (IMODIUM) 2 MG capsule Take by mouth.    [provider]  melatonin 5 MG TABS Take 5 mg by mouth as needed.    [provider]  Multiple Vitamin (MULTI-VITAMIN)  tablet Take 1 tablet by mouth daily.    [provider]  Probiotic Product (PROBIOTIC-10 PO) Take by mouth. Patient not taking: Reported on 10/25/2022    [provider]  Quercetin 500 MG CAPS Take 1 capsule by mouth daily.    [provider]  QUETIAPINE FUMARATE PO Take 6.2 mg by mouth daily at 2 am.    [provider]  sertraline (ZOLOFT) 50 MG tablet Take 75 mg by mouth. Take 1 and 1/2 tablet daily to equal 75 mg    [provider]    Allergies as of 12/10/2022 - Review Complete 11/01/2022  Allergen Reaction Noted   Covid-19 (adenovirus) vaccine Cough 09/15/2021    Family History  Problem Relation Age of Onset   Breast cancer Mother 26   Non-Hodgkin's lymphoma Father    Prostate cancer Brother    Breast cancer Maternal Aunt 40   Breast cancer Maternal Grandmother        mat gm and mat great gm    Social History   Socioeconomic History   Marital status: Married    Spouse name: Not on file   Number of children: Not on file   Years of education: Not on file   Highest education level: Not on file  Occupational History   Not on file  Tobacco Use   Smoking status: Never   Smokeless tobacco: Never  Vaping Use  Vaping Use: Never used  Substance and Sexual Activity   Alcohol use: Yes    Alcohol/week: 7.0 standard drinks of alcohol    Types: 7 Cans of beer per week    Comment: 1 craft beer daily   Drug use: No   Sexual activity: Not on file  Other Topics Concern   Not on file  Social History Narrative   Not on file   Social Determinants of Health   Financial Resource Strain: Low Risk  (12/26/2021)   Overall Financial Resource Strain (CARDIA)    Difficulty of Paying Living Expenses: Not hard at all  Food Insecurity: No Food Insecurity (12/26/2021)   Hunger Vital Sign    Worried About Running Out of Food in the Last Year: Never true    Ran Out of Food in the Last Year: Never true  Transportation Needs: No Transportation  Needs (12/26/2021)   PRAPARE - Administrator, Civil Service (Medical): No    Lack of Transportation (Non-Medical): No  Physical Activity: Insufficiently Active (12/26/2021)   Exercise Vital Sign    Days of Exercise per Week: 7 days    Minutes of Exercise per Session: 20 min  Stress: No Stress Concern Present (12/26/2021)   [employer] of Occupational Health - Occupational Stress Questionnaire    Feeling of Stress : Not at all  Social Connections: Unknown (12/26/2021)   Social Connection and Isolation Panel [NHANES]    Frequency of Communication with Friends and Family: Not on file    Frequency of Social Gatherings with Friends and Family: Not on file    Attends Religious Services: Not on file    Active Member of Clubs or Organizations: Not on file    Attends Banker Meetings: Not on file    Marital Status: Married  Intimate Partner Violence: Not At Risk (12/26/2021)   Humiliation, Afraid, Rape, and Kick questionnaire    Fear of Current or Ex-Partner: No    Emotionally Abused: No    Physically Abused: No    Sexually Abused: No    Review of Systems: See HPI, otherwise negative ROS  Physical Exam: Ht 5\' 10"  (1.778 m)   Wt 66.6 kg   BMI 21.07 kg/m  General:   Alert, cooperative in NAD Head:  Normocephalic and atraumatic. Respiratory:  Normal work of breathing. Cardiovascular:  RRR  Impression/Plan: Teresa Hogan is here for cataract surgery.  Risks, benefits, limitations, and alternatives regarding cataract surgery have been reviewed with the patient.  Questions have been answered.  All parties agreeable.   Estanislado Pandy, MD  12/20/2022, 11:06 AM

## 2022-12-20 NOTE — Op Note (Signed)
OPERATIVE NOTE  KA GAYDON [id number] 12/20/2022   PREOPERATIVE DIAGNOSIS: Nuclear sclerotic cataract left eye. H25.12   POSTOPERATIVE DIAGNOSIS: Nuclear sclerotic cataract left eye. H25.12   PROCEDURE:  Phacoemusification with posterior chamber intraocular lens placement of the left eye  Ultrasound time: Procedure(s): CATARACT EXTRACTION PHACO AND INTRAOCULAR LENS PLACEMENT (IOC) LEFT  7.52  00:51.7 (Left)  LENS:   Implant Name Type Inv. Item Serial No. Manufacturer Lot No. LRB No. Used Action  LENS IOL TECNIS EYHANCE 18.5 - [id number] Intraocular Lens LENS IOL TECNIS EYHANCE 18.5 [id number] [employer]  Left 1 Implanted      SURGEON:  Julious Payer. Rolley Sims, MD   ANESTHESIA:  Topical with tetracaine drops, augmented with 1% preservative-free intracameral lidocaine.   COMPLICATIONS:  None.   DESCRIPTION OF PROCEDURE:  The patient was identified in the holding room and transported to the operating room and placed in the supine position under the operating microscope.  The left eye was identified as the operative eye, which was prepped and draped in the usual sterile ophthalmic fashion.   A 1 millimeter clear-corneal paracentesis was made inferotemporally. Preservative-free 1% lidocaine mixed with 1:1,000 bisulfite-free aqueous solution of epinephrine was injected into the anterior chamber. The anterior chamber was then filled with Viscoat viscoelastic. A 2.4 millimeter keratome was used to make a clear-corneal incision superotemporally. A curvilinear capsulorrhexis was made with a cystotome and capsulorrhexis forceps. Balanced salt solution was used to hydrodissect and hydrodelineate the nucleus. Phacoemulsification was then used to remove the lens nucleus and epinucleus. The remaining cortex was then removed using the irrigation and aspiration handpiece. Provisc was then placed into the capsular bag to distend it for lens placement. A +18.50 D [id number] lens was then injected into  the capsular bag. The remaining viscoelastic was aspirated.   Wounds were hydrated with balanced salt solution.  The anterior chamber was inflated to a physiologic pressure with balanced salt solution.  No wound leaks were noted. Vigamox was injected intracamerally.  Timolol and Brimonidine drops were applied to the eye.  The patient was taken to the recovery room in stable condition without complications of anesthesia or surgery.  [employer] 12/20/2022, 2:00 PM

## 2022-12-20 NOTE — Transfer of Care (Signed)
Immediate Anesthesia Transfer of Care Note  Patient: Teresa Hogan  Procedure(s) Performed: CATARACT EXTRACTION PHACO AND INTRAOCULAR LENS PLACEMENT (IOC) LEFT  7.52  00:51.7 (Left: Eye)  Patient Location: PACU  Anesthesia Type: MAC  Level of Consciousness: awake, alert  and patient cooperative  Airway and Oxygen Therapy: Patient Spontanous Breathing and Patient connected to supplemental oxygen  Post-op Assessment: Post-op Vital signs reviewed, Patient's Cardiovascular Status Stable, Respiratory Function Stable, Patent Airway and No signs of Nausea or vomiting  Post-op Vital Signs: Reviewed and stable  Complications: No notable events documented.

## 2022-12-21 ENCOUNTER — Encounter: Payer: Self-pay | Admitting: Ophthalmology

## 2022-12-21 NOTE — Anesthesia Postprocedure Evaluation (Signed)
Anesthesia Post Note  Patient: Marycatherine Maniscalco Mastandrea  Procedure(s) Performed: CATARACT EXTRACTION PHACO AND INTRAOCULAR LENS PLACEMENT (IOC) LEFT  7.52  00:51.7 (Left: Eye)  Patient location during evaluation: PACU Anesthesia Type: MAC Level of consciousness: awake and alert Pain management: pain level controlled Vital Signs Assessment: post-procedure vital signs reviewed and stable Respiratory status: spontaneous breathing, nonlabored ventilation, respiratory function stable and patient connected to nasal cannula oxygen Cardiovascular status: blood pressure returned to baseline and stable Postop Assessment: no apparent nausea or vomiting Anesthetic complications: no   No notable events documented.   Last Vitals:  Vitals:   12/20/22 1401 12/20/22 1407  BP: (!) 118/51 110/68  Pulse: 73 66  Resp: (!) 9 12  Temp: (!) 36.3 C (!) 36.1 C  SpO2: 93% 96%    Last Pain:  Vitals:   12/21/22 1303  PainSc: 0-No pain                 Jacques Willingham C Kateryn Marasigan

## 2023-01-01 IMAGING — US US RENAL
1 series · 14 of 25 positions shown · non-contrast
Comparison: None Available.

CLINICAL DATA: Chronic renal disease

EXAM:
RENAL / URINARY TRACT ULTRASOUND COMPLETE

[Series 1: us renal · 0.23mm/px · 14 of 31 slices shown]
[im 1/31]
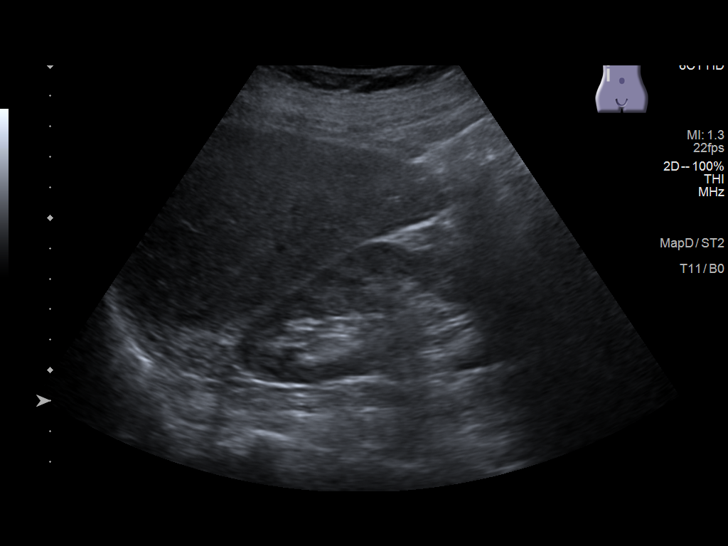
[im 3/31]
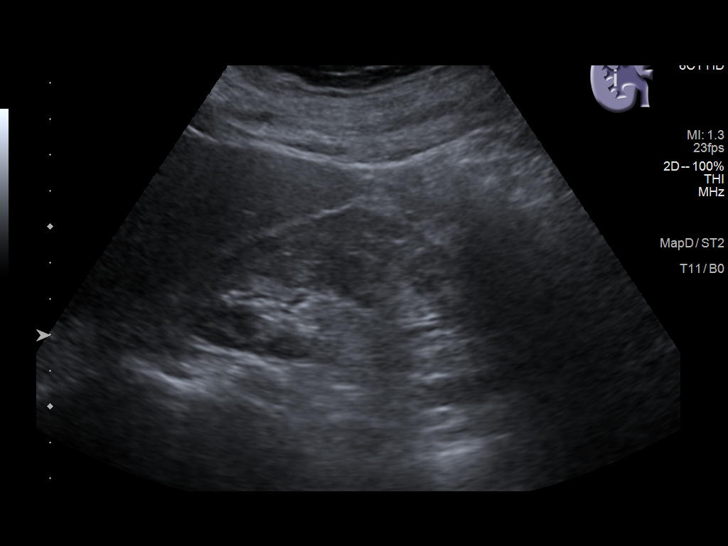
[im 6/31]
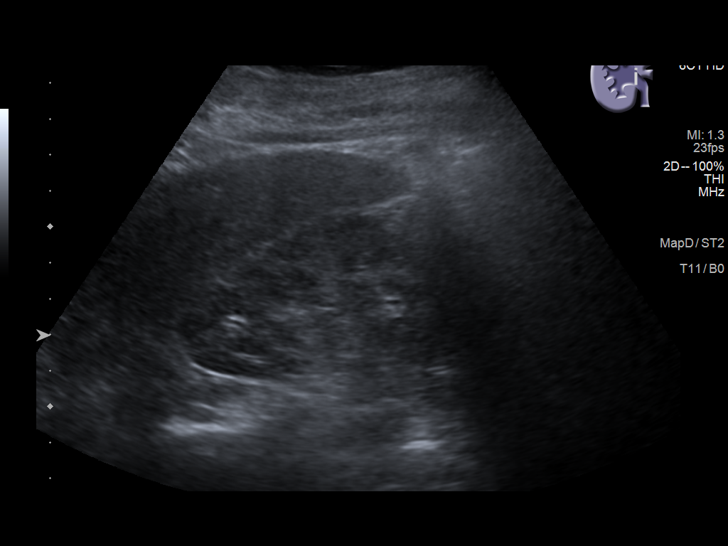
[im 8/31]
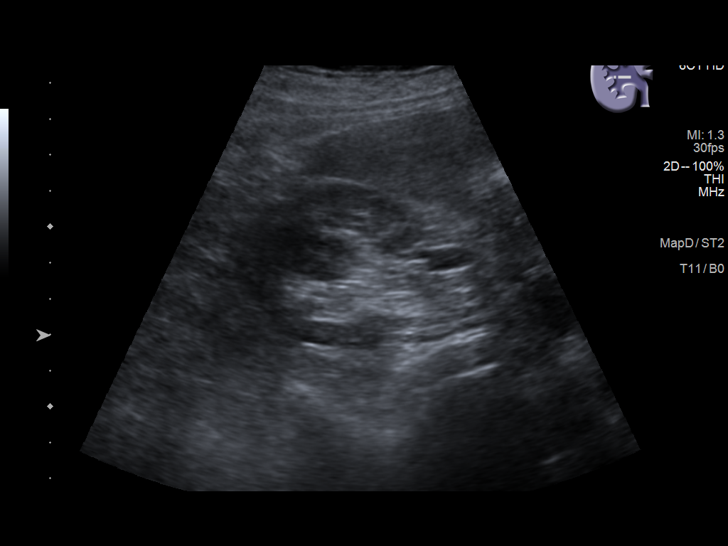
[im 11/31]
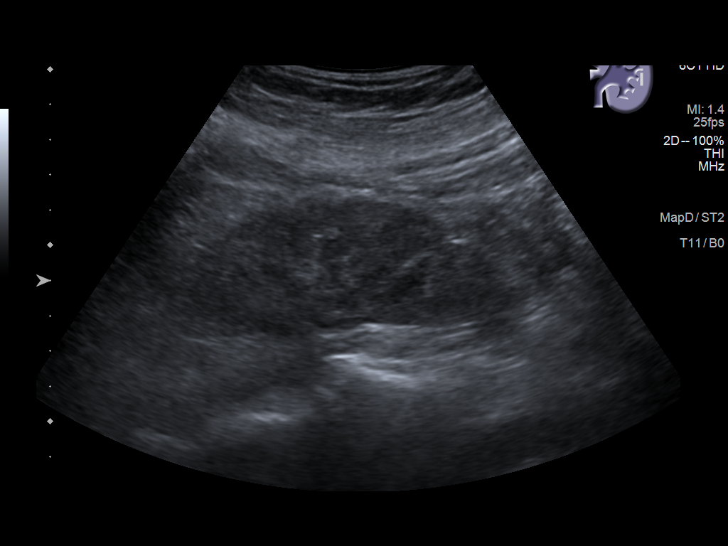
[im 12/31]
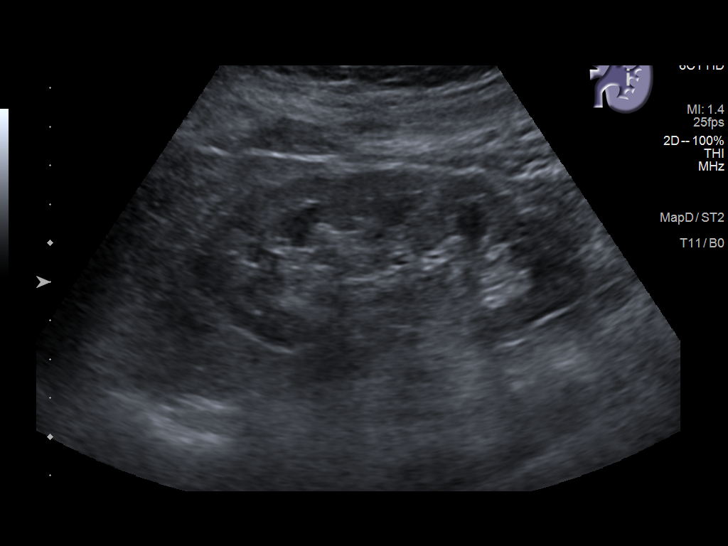
[im 14/31]
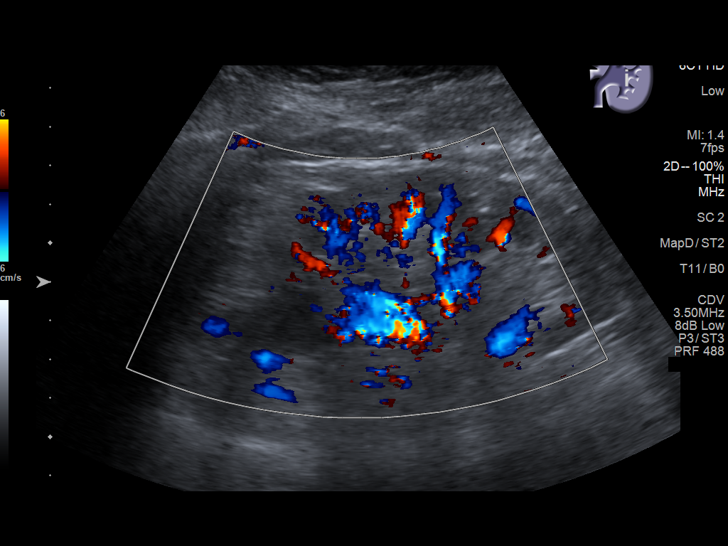
[im 17/31]
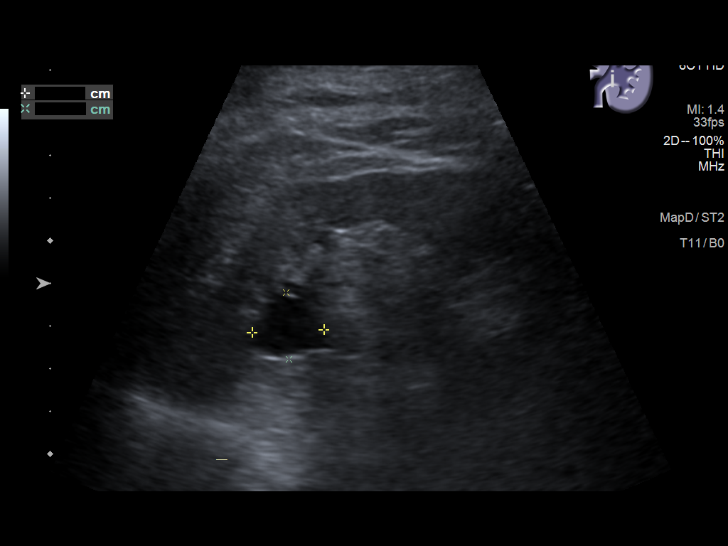
[im 19/31]
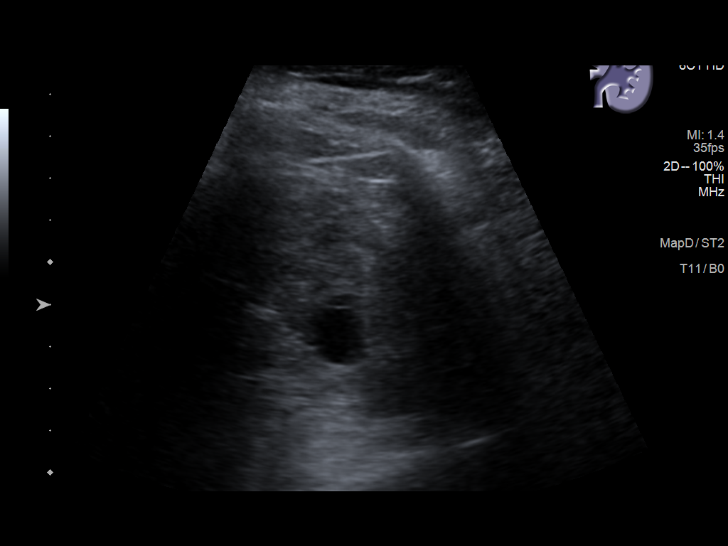
[im 21/31]
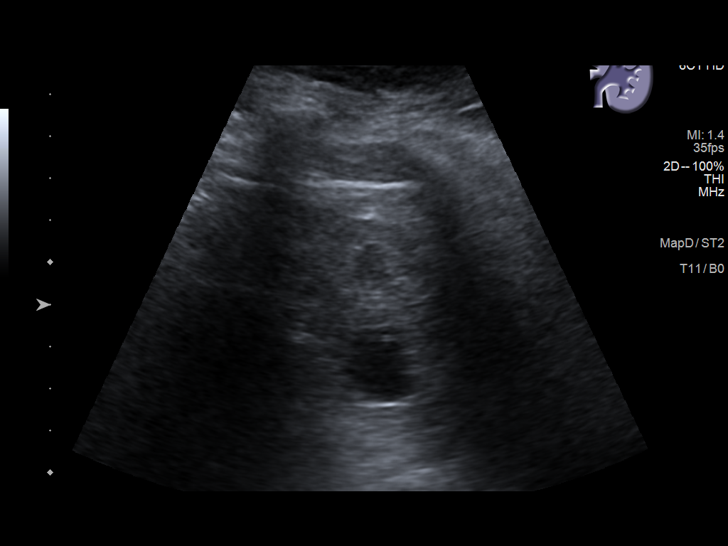
[im 23/31]
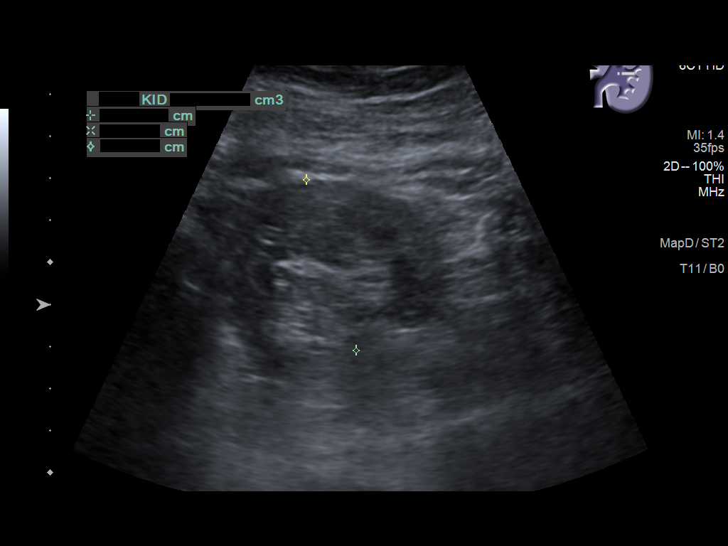
[im 26/31]
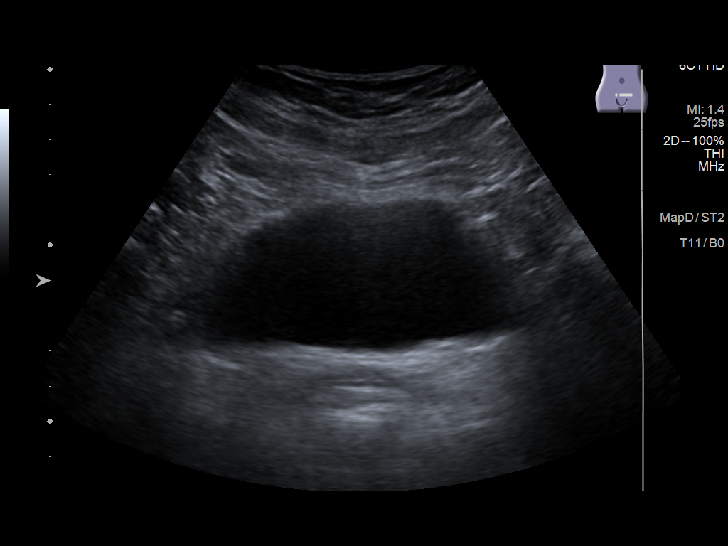
[im 28/31]
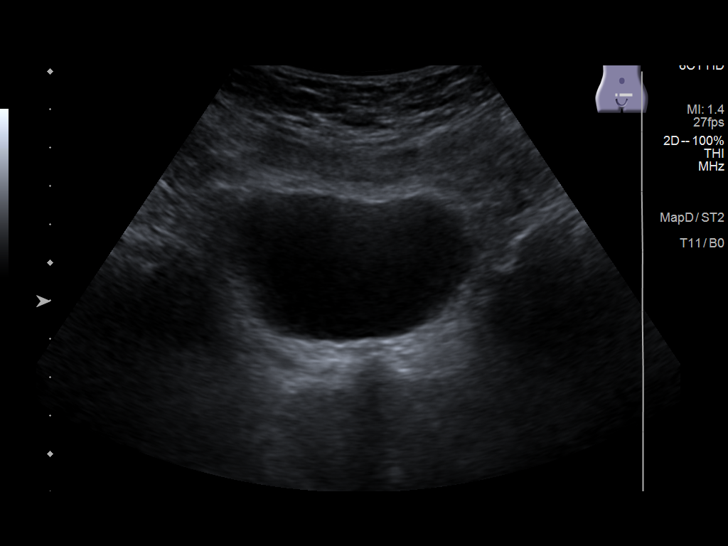
[im 31/31]
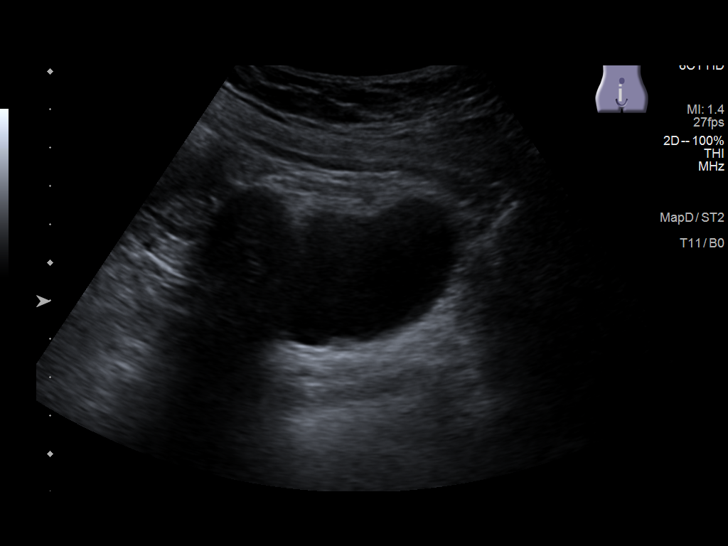

[14 of 25 positions shown; findings below may reference images not displayed]

FINDINGS: Right Kidney:

Renal measurements: 10.1 x 4.6 x 4.8 cm = volume: 118 mL.
Echogenicity within normal limits. No mass or hydronephrosis
visualized.

Left Kidney:

Renal measurements: 10.2 x 4.8 x 4.2 cm = volume: 107 mL. Contains a
1.7 cm simple cyst in the medial upper pole of no clinical
significance. No follow-up imaging recommended.

Bladder:

Appears normal for degree of bladder distention.

Other:

None.
IMPRESSION: No cause for chronic renal disease identified. No significant
abnormalities.

## 2023-01-25 DIAGNOSIS — F3342 Major depressive disorder, recurrent, in full remission: Secondary | ICD-10-CM | POA: Diagnosis not present

## 2023-02-11 DIAGNOSIS — F3342 Major depressive disorder, recurrent, in full remission: Secondary | ICD-10-CM | POA: Diagnosis not present

## 2023-02-25 DIAGNOSIS — F341 Dysthymic disorder: Secondary | ICD-10-CM | POA: Diagnosis not present

## 2023-02-25 DIAGNOSIS — F3342 Major depressive disorder, recurrent, in full remission: Secondary | ICD-10-CM | POA: Diagnosis not present

## 2023-03-11 DIAGNOSIS — F341 Dysthymic disorder: Secondary | ICD-10-CM | POA: Diagnosis not present

## 2023-03-11 DIAGNOSIS — F3342 Major depressive disorder, recurrent, in full remission: Secondary | ICD-10-CM | POA: Diagnosis not present

## 2023-07-30 DIAGNOSIS — F341 Dysthymic disorder: Secondary | ICD-10-CM | POA: Diagnosis not present

## 2023-07-30 DIAGNOSIS — F3342 Major depressive disorder, recurrent, in full remission: Secondary | ICD-10-CM | POA: Diagnosis not present

## 2023-10-21 DIAGNOSIS — F3342 Major depressive disorder, recurrent, in full remission: Secondary | ICD-10-CM | POA: Diagnosis not present

## 2023-10-21 DIAGNOSIS — F341 Dysthymic disorder: Secondary | ICD-10-CM | POA: Diagnosis not present

## 2023-10-28 DIAGNOSIS — D72829 Elevated white blood cell count, unspecified: Secondary | ICD-10-CM | POA: Diagnosis not present

## 2023-10-28 DIAGNOSIS — F32A Depression, unspecified: Secondary | ICD-10-CM | POA: Diagnosis not present

## 2023-10-28 DIAGNOSIS — R1033 Periumbilical pain: Secondary | ICD-10-CM | POA: Diagnosis not present

## 2023-10-28 DIAGNOSIS — R9389 Abnormal findings on diagnostic imaging of other specified body structures: Secondary | ICD-10-CM | POA: Diagnosis not present

## 2023-10-28 DIAGNOSIS — R1013 Epigastric pain: Secondary | ICD-10-CM | POA: Diagnosis not present

## 2023-10-28 DIAGNOSIS — R1110 Vomiting, unspecified: Secondary | ICD-10-CM | POA: Diagnosis not present

## 2023-10-28 DIAGNOSIS — R9431 Abnormal electrocardiogram [ECG] [EKG]: Secondary | ICD-10-CM | POA: Diagnosis not present

## 2023-10-28 DIAGNOSIS — R1084 Generalized abdominal pain: Secondary | ICD-10-CM | POA: Diagnosis not present

## 2023-10-29 DIAGNOSIS — R9389 Abnormal findings on diagnostic imaging of other specified body structures: Secondary | ICD-10-CM | POA: Diagnosis not present

## 2023-10-29 DIAGNOSIS — R1033 Periumbilical pain: Secondary | ICD-10-CM | POA: Diagnosis not present

## 2023-11-21 ENCOUNTER — Encounter: Payer: Self-pay | Admitting: Internal Medicine

## 2023-11-21 ENCOUNTER — Ambulatory Visit (INDEPENDENT_AMBULATORY_CARE_PROVIDER_SITE_OTHER): Admitting: Internal Medicine

## 2023-11-21 VITALS — BP 108/66 | HR 82 | Ht 70.0 in | Wt 144.2 lb

## 2023-11-21 DIAGNOSIS — Z1231 Encounter for screening mammogram for malignant neoplasm of breast: Secondary | ICD-10-CM | POA: Diagnosis not present

## 2023-11-21 DIAGNOSIS — Z780 Asymptomatic menopausal state: Secondary | ICD-10-CM | POA: Diagnosis not present

## 2023-11-21 DIAGNOSIS — N8500 Endometrial hyperplasia, unspecified: Secondary | ICD-10-CM | POA: Diagnosis not present

## 2023-11-21 DIAGNOSIS — Z0000 Encounter for general adult medical examination without abnormal findings: Secondary | ICD-10-CM

## 2023-11-21 DIAGNOSIS — N1831 Chronic kidney disease, stage 3a: Secondary | ICD-10-CM | POA: Diagnosis not present

## 2023-11-21 DIAGNOSIS — F331 Major depressive disorder, recurrent, moderate: Secondary | ICD-10-CM | POA: Diagnosis not present

## 2023-11-21 NOTE — Assessment & Plan Note (Addendum)
 Suggested by measurement on recent CT abd and pelvis : Endometrial thickness measures approximately 1.1 cm, which is abnormally thickened for a postmenopausal patient. Patient has hsitoryf o5 yrs of tamxifen use in the 32's when enrolled in a Breast Cancer Prevention Tial at [facility].  Referring to [facility]

## 2023-11-21 NOTE — Progress Notes (Unsigned)
 Patient ID: Teresa Hogan, female    DOB: 1946/01/04  Age: 78 y.o. MRN: [id number]  The patient is here for annual  wellness examination .  Last seen August 2023   The risk factors are reflected in the social history.  The roster of all physicians providing medical care to patient - is listed in the Snapshot section of the chart.  Activities of daily living:  The patient is 100% independent in all ADLs: dressing, toileting, feeding as well as independent mobility  Home safety : The patient has smoke detectors in the home. They wear seatbelts.  There are no firearms at home. There is no violence in the home.   There is no risks for hepatitis, STDs or HIV. There is no   history of blood transfusion. They have no travel history to infectious disease endemic areas of the world.  The patient has seen their dentist in the last six month. They have seen their eye doctor in the last year. They admit to slight hearing difficulty with regard to whispered voices and some television programs.  They have deferred audiologic testing in the last year.  They do not  have excessive sun exposure. Discussed the need for sun protection: hats, long sleeves and use of sunscreen if there is significant sun exposure.   Diet: the importance of a healthy diet is discussed. They do have a healthy diet.  The benefits of regular aerobic exercise were discussed. She walks 4 times per week ,  60 minutes.   Depression screen: there are no signs or vegative symptoms of depression- irritability, change in appetite, anhedonia, sadness/tearfullness.  Cognitive assessment: the patient manages all their financial and personal affairs and is actively engaged. They could relate day,date,year and events; recalled 2/3 objects at 3 minutes; performed clock-face test normally.  The following portions of the patient's history were reviewed and updated as appropriate: allergies, current medications, past family history, past medical  history,  past surgical history, past social history  and problem list.  Visual acuity was not assessed per patient preference since she has regular follow up with her ophthalmologist. Hearing and body mass index were assessed and reviewed.   During the course of the visit the patient was educated and counseled about appropriate screening and preventive services including : fall prevention , diabetes screening, nutrition counseling, colorectal cancer screening, and recommended immunizations.    CC: The primary encounter diagnosis was Postmenopausal estrogen deficiency. Diagnoses of Encounter for screening mammogram for malignant neoplasm of breast, Endometrial hyperplasia, Encounter for preventive health examination, Stage 3a chronic kidney disease (HCC), and Moderate episode of recurrent major depressive disorder (HCC) were also pertinent to this visit.  April 29 ER visit at [facility] for severe  perimbilical abd pain and vomiting after eating .  Prior workup for recurrent episodes  has been non diagnostic.  [facility] did a  CT abd pelvis which was normal except for endometrial thickening   1.1 cm .  Prior colonoscopy  was deferred in 2023 , she does not recall when her last colonoscopy was done. Per notes from [facility] visit in 2020 :  : she was seen on 06/04/17 by Dr Corky Diener for problems with diarrhea without abdominal pain. tsh and celiac were normal/negative. Stool tests: culture/o&p/fecal lactoferrin normal/negative. Colonoscopy 12/18- normal appearing colon with negative biopsies. 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  Previous consultation with  a 'microbiome specialist."  Currently having an attack of which started yesterday which she treats with loperamide  and yogurt    History [name] has a past medical history of Cancer (HCC), Depression, Motion sickness, and PONV (postoperative nausea and vomiting).   She has a past surgical history that includes Breast biopsy (Left); Breast excisional biopsy (Left); Cataract extraction w/PHACO (Right, 11/01/2022); and Cataract extraction w/PHACO (Left, 12/20/2022).   Her family history includes Breast cancer in her maternal grandmother; Breast cancer (age of onset: 61) in her maternal aunt; Breast cancer (age of onset: 25) in her mother; Non-Hodgkin's lymphoma in her father; Prostate cancer in her brother.She reports that she has never smoked. She has never used smokeless tobacco. She reports current alcohol use of about 7.0 standard drinks of alcohol per week. She reports that she does not use drugs.  Outpatient Medications Prior to Visit  Medication Sig Dispense Refill   Acetylcysteine 600 MG CAPS Take by mouth.     ascorbic acid (VITAMIN C) 500 MG tablet Take by mouth.     buPROPion (WELLBUTRIN SR) 200 MG 12 hr tablet Take by mouth.     cetirizine (ZYRTEC) 10 MG tablet Take by mouth.     Cholecalciferol 50 MCG (2000 UT) CAPS Take 3 capsules daily for 3 months, then reduce to 1 capsule daily thereafter for Vitamin D Deficiency.     fluticasone (FLONASE) 50 MCG/ACT nasal spray Place into the nose.     loperamide (IMODIUM) 2 MG capsule Take by mouth.     melatonin 5 MG TABS Take 5 mg by mouth as needed.     Multiple Vitamin (MULTI-VITAMIN) tablet Take 1 tablet by mouth daily.     Probiotic Product (PROBIOTIC PO) Take 1 capsule by mouth daily.     Quercetin 500 MG CAPS Take 1 capsule by mouth daily.     QUETIAPINE FUMARATE PO Take 6.2 mg by mouth daily at 2 am.     sertraline (ZOLOFT) 50 MG tablet Take 50 mg by mouth daily. Take 1 and 1/2 tablet daily to equal 75 mg     Calcium Carb-Cholecalciferol 500-10 MG-MCG TABS Take 10 mg by mouth in the morning and at bedtime. (Patient not taking: Reported on 11/21/2023)      glucosamine-chondroitin 500-400 MG tablet Take 1 tablet by mouth 3 (three) times daily. (Patient not taking: Reported on 11/21/2023)     Probiotic Product (PROBIOTIC-10 PO) Take by mouth. (Patient not taking: Reported on 10/25/2022)     No facility-administered medications prior to visit.    Review of Systems  Patient denies headache, fevers, malaise, unintentional weight loss, skin rash, eye pain, sinus congestion and sinus pain, sore throat, dysphagia,  hemoptysis , cough, dyspnea, wheezing, chest pain, palpitations, orthopnea, edema,  nausea, melena, , constipation, flank pain, dysuria, hematuria, urinary  Frequency, nocturia, numbness, tingling, seizures,  Focal weakness, Loss of consciousness,  Tremor, insomnia, depression, anxiety, and suicidal ideation.     Objective:  BP 108/66   Pulse 82   Ht 5\' 10"  (1.778 m)   Wt 144 lb 3.2 oz (65.4 kg)   SpO2 99%   BMI 20.69 kg/m   Physical Exam Vitals reviewed.  Constitutional:      General: She is not in acute distress.    Appearance: Normal appearance. She is normal weight.  She is not ill-appearing, toxic-appearing or diaphoretic.  HENT:     Head: Normocephalic.  Eyes:     General: No scleral icterus.       Right eye: No discharge.        Left eye: No discharge.     Conjunctiva/sclera: Conjunctivae normal.  Cardiovascular:     Rate and Rhythm: Normal rate and regular rhythm.     Heart sounds: Normal heart sounds.  Pulmonary:     Effort: Pulmonary effort is normal. No respiratory distress.     Breath sounds: Normal breath sounds.  Musculoskeletal:        General: Normal range of motion.  Skin:    General: Skin is warm and dry.  Neurological:     General: No focal deficit present.     Mental Status: She is alert and oriented to person, place, and time. Mental status is at baseline.  Psychiatric:        Mood and Affect: Mood normal.        Behavior: Behavior normal.        Thought Content: Thought content normal.         Judgment: Judgment normal.    Assessment & Plan:  Postmenopausal estrogen deficiency -     DG Bone Density; Future  Encounter for screening mammogram for malignant neoplasm of breast -     3D Screening Mammogram, Left and Right; Future  Endometrial hyperplasia Assessment & Plan: Suggested by measurement on recent CT abd and pelvis : Endometrial thickness measures approximately 1.1 cm, which is abnormally thickened for a postmenopausal patient. Patient has hsitoryf o5 yrs of tamxifen use in the 13's when enrolled in a Breast Cancer Prevention Tial at [facility].  Referring to dR Cousins   Orders: -     Ambulatory referral to Gynecology  Encounter for preventive health examination Assessment & Plan: age appropriate education and counseling updated, referrals for preventative services and immunizations addressed, dietary and smoking counseling addressed, most recent labs reviewed.  I have personally reviewed and have noted:   1) the patient's medical and social history 2) The pt's use of alcohol, tobacco, and illicit drugs 3) The patient's current medications and supplements 4) Functional ability including ADL's, fall risk, home safety risk, hearing and visual impairment 5) Diet and physical activities 6) Evidence for depression or mood disorder 7) The patient's height, weight, and BMI have been recorded in the chart   I have made referrals, and provided counseling and education based on review of the above    Stage 3a chronic kidney disease (HCC) Assessment & Plan:  stable over the past several years based on review of labs from [place] [facility]and more recent outside labs.   Lab Results  Component Value Date   CREATININE 0.84 06/15/2022      Moderate episode of recurrent major depressive disorder [facility]) Assessment & Plan: Currently managed with zoloft , wellbutrin and seroquel by psychiatry . Mood is stable,  no changes today       Follow-up: No follow-ups on  file.   Thersia Flax, MD

## 2023-11-21 NOTE — Patient Instructions (Signed)
 I am referring you to Dr Lesta Rater in [place] to evaluate your endometrial lining which was thicker than expected om your recent CT  Continue your current medications.  If the diarrhea does not improve,  or if the abdominal pain recurs,  let me know and I will refer you to GI again  Your mammogram and bone density test  have been ordered.  Please call to make your appointment at [facility]  [phone]

## 2023-11-22 ENCOUNTER — Telehealth: Payer: Self-pay | Admitting: Internal Medicine

## 2023-11-22 NOTE — Telephone Encounter (Signed)
 Ct and demographics faxed. Note has not been completed just of yet. Will hold til return on Tuesday

## 2023-11-22 NOTE — Telephone Encounter (Signed)
 Copied from CRM [id number]. Topic: Referral - Question >> Nov 21, 2023  4:40 PM Juleen Oakland F wrote: Reason for CRM: [facility] called, needs patients CT Scan report, insurance information and last office visit faxed to [phone] in order to get patient scheduled. Please advise.

## 2023-11-25 DIAGNOSIS — Z0000 Encounter for general adult medical examination without abnormal findings: Secondary | ICD-10-CM | POA: Insufficient documentation

## 2023-11-25 NOTE — Assessment & Plan Note (Signed)

## 2023-11-25 NOTE — Assessment & Plan Note (Signed)
 stable over the past several years based on review of labs [facility] [place] [facility] more recent outside labs.   Lab Results  Component Value Date   CREATININE 0.84 06/15/2022

## 2023-11-25 NOTE — Assessment & Plan Note (Signed)
 Currently managed with zoloft , wellbutrin and seroquel by psychiatry . Mood is stable,  no changes today

## 2023-12-02 DIAGNOSIS — Z780 Asymptomatic menopausal state: Secondary | ICD-10-CM | POA: Diagnosis not present

## 2023-12-02 DIAGNOSIS — N904 Leukoplakia of vulva: Secondary | ICD-10-CM | POA: Diagnosis not present

## 2023-12-02 DIAGNOSIS — R9389 Abnormal findings on diagnostic imaging of other specified body structures: Secondary | ICD-10-CM | POA: Diagnosis not present

## 2023-12-04 NOTE — Telephone Encounter (Signed)
Noted has been completed and faxed

## 2023-12-06 ENCOUNTER — Ambulatory Visit

## 2023-12-06 ENCOUNTER — Ambulatory Visit: Payer: Self-pay

## 2023-12-06 DIAGNOSIS — F3342 Major depressive disorder, recurrent, in full remission: Secondary | ICD-10-CM | POA: Diagnosis not present

## 2023-12-06 DIAGNOSIS — S52514A Nondisplaced fracture of right radial styloid process, initial encounter for closed fracture: Secondary | ICD-10-CM | POA: Diagnosis not present
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
## 2023-12-06 NOTE — Telephone Encounter (Signed)
 Noted

## 2023-12-31 DIAGNOSIS — N904 Leukoplakia of vulva: Secondary | ICD-10-CM | POA: Diagnosis not present

## 2023-12-31 DIAGNOSIS — Z780 Asymptomatic menopausal state: Secondary | ICD-10-CM | POA: Diagnosis not present

## 2023-12-31 DIAGNOSIS — N9489 Other specified conditions associated with female genital organs and menstrual cycle: Secondary | ICD-10-CM | POA: Diagnosis not present

## 2023-12-31 DIAGNOSIS — R9389 Abnormal findings on diagnostic imaging of other specified body structures: Secondary | ICD-10-CM | POA: Diagnosis not present

## 2024-01-07 DIAGNOSIS — S52514D Nondisplaced fracture of right radial styloid process, subsequent encounter for closed fracture with routine healing: Secondary | ICD-10-CM | POA: Diagnosis not present

## 2024-01-07 DIAGNOSIS — M19041 Primary osteoarthritis, right hand: Secondary | ICD-10-CM | POA: Diagnosis not present

## 2024-01-07 DIAGNOSIS — M19031 Primary osteoarthritis, right wrist: Secondary | ICD-10-CM | POA: Diagnosis not present

## 2024-01-09 ENCOUNTER — Other Ambulatory Visit: Payer: Self-pay | Admitting: Obstetrics and Gynecology

## 2024-01-10 DIAGNOSIS — Z961 Presence of intraocular lens: Secondary | ICD-10-CM | POA: Diagnosis not present

## 2024-01-10 DIAGNOSIS — H04123 Dry eye syndrome of bilateral lacrimal glands: Secondary | ICD-10-CM | POA: Diagnosis not present

## 2024-01-10 DIAGNOSIS — H26493 Other secondary cataract, bilateral: Secondary | ICD-10-CM | POA: Diagnosis not present

## 2024-01-13 ENCOUNTER — Telehealth: Payer: Self-pay | Admitting: *Deleted

## 2024-01-13 ENCOUNTER — Ambulatory Visit: Payer: Medicare Other | Admitting: *Deleted

## 2024-01-13 VITALS — Ht 69.0 in | Wt 144.0 lb

## 2024-01-13 DIAGNOSIS — Z0000 Encounter for general adult medical examination without abnormal findings: Secondary | ICD-10-CM

## 2024-01-13 NOTE — Progress Notes (Signed)
 Subjective:   Teresa Hogan is a 78 y.o. who presents for a Medicare Wellness preventive visit.  As a reminder, Annual Wellness Visits don't include a physical exam, and some assessments may be limited, especially if this visit is performed virtually. We may recommend an in-person follow-up visit with your provider if needed.  Visit Complete: In person  VideoDeclined- This patient declined Interactive audio and Acupuncturist. Therefore the visit was completed with audio only.  Persons Participating in Visit: Patient.  AWV Questionnaire: Yes: Patient Medicare AWV questionnaire was completed by the patient on 01/12/24; I have confirmed that all information answered by patient is correct and no changes since this date.  Cardiac Risk Factors include: advanced age (>38men, >32 women)     Objective:    Today's Vitals   01/13/24 0849  Weight: 144 lb (65.3 kg)  Height: 5' 9 (1.753 m)   Body mass index is 21.27 kg/m.     01/13/2024    9:01 AM 12/20/2022   12:30 PM 11/01/2022   12:08 PM 06/21/2022   10:20 AM 12/26/2021   11:14 AM 12/12/2021    2:23 PM 11/22/2021   11:12 AM  Advanced Directives  Does Patient Have a Medical Advance Directive? Yes Yes Yes No Yes Yes Yes  Type of Estate agent of [place];Living will Healthcare Power of [place];Living will Healthcare Power of [place];Living will  Healthcare Power of [place];Living will Healthcare Power of [employer] of [place];Living will  Does patient want to make changes to medical advance directive?  No - Patient declined   No - Patient declined    Copy of Healthcare Power of Attorney in Chart? No - copy requested No - copy requested No - copy requested  No - copy requested  No - copy requested    Current Medications (verified) Outpatient Encounter Medications as of 01/13/2024  Medication Sig   Acetylcysteine 600 MG CAPS Take by mouth.   ascorbic acid (VITAMIN C) 500 MG tablet  Take by mouth.   buPROPion (WELLBUTRIN SR) 200 MG 12 hr tablet Take by mouth.   Cholecalciferol 50 MCG (2000 UT) CAPS Take 3 capsules daily for 3 months, then reduce to 1 capsule daily thereafter for Vitamin D Deficiency.   fluticasone (FLONASE) 50 MCG/ACT nasal spray Place into the nose.   loperamide (IMODIUM) 2 MG capsule Take by mouth. (Patient taking differently: Take by mouth as needed.)   melatonin 5 MG TABS Take 5 mg by mouth as needed.   Multiple Vitamin (MULTI-VITAMIN) tablet Take 1 tablet by mouth daily.   Quercetin 500 MG CAPS Take 1 capsule by mouth daily.   QUETIAPINE FUMARATE PO Take 6.2 mg by mouth daily at 2 am.   sertraline (ZOLOFT) 50 MG tablet Take 50 mg by mouth daily. Take 1 and 1/2 tablet daily to equal 75 mg   cetirizine (ZYRTEC) 10 MG tablet Take by mouth.   Probiotic Product (PROBIOTIC PO) Take 1 capsule by mouth daily. (Patient not taking: Reported on 01/13/2024)   No facility-administered encounter medications on file as of 01/13/2024.    Allergies (verified) Covid-19 (adenovirus) vaccine   History: Past Medical History:  Diagnosis Date   Cancer (HCC)    skin ca   Depression    Motion sickness    PONV (postoperative nausea and vomiting)    Nausea after breast biopsy many years ago   Past Surgical History:  Procedure Laterality Date   BREAST BIOPSY Left    neg  BREAST EXCISIONAL BIOPSY Left    CATARACT EXTRACTION W/PHACO Right 11/01/2022   Procedure: CATARACT EXTRACTION PHACO AND INTRAOCULAR LENS PLACEMENT (IOC) RIGHT  13.28  01:18.9;  Surgeon: Enola Feliciano Hugger, MD;  Location: [facility];  Service: Ophthalmology;  Laterality: Right;   CATARACT EXTRACTION W/PHACO Left 12/20/2022   Procedure: CATARACT EXTRACTION PHACO AND INTRAOCULAR LENS PLACEMENT (IOC) LEFT  7.52  00:51.7;  Surgeon: Enola Feliciano Hugger, MD;  Location: [facility];  Service: Ophthalmology;  Laterality: Left;   Family History  Problem Relation Age of Onset    Breast cancer Mother 34   Non-Hodgkin's lymphoma Father    Prostate cancer Brother    Breast cancer Maternal Aunt 40   Breast cancer Maternal Grandmother        mat gm and mat great gm   Social History   Socioeconomic History   Marital status: Married    Spouse name: Not on file   Number of children: Not on file   Years of education: Not on file   Highest education level: Doctorate  Occupational History   Not on file  Tobacco Use   Smoking status: Never   Smokeless tobacco: Never  Vaping Use   Vaping status: Never Used  Substance and Sexual Activity   Alcohol use: Yes    Alcohol/week: 7.0 standard drinks of alcohol    Types: 7 Cans of beer per week    Comment: 1 craft beer daily   Drug use: No   Sexual activity: Not on file  Other Topics Concern   Not on file  Social History Narrative   married   Social Drivers of Corporate investment banker Strain: Low Risk  (01/12/2024)   Overall Financial Resource Strain (CARDIA)    Difficulty of Paying Living Expenses: Not hard at all  Food Insecurity: No Food Insecurity (01/12/2024)   Hunger Vital Sign    Worried About Running Out of Food in the Last Year: Never true    Ran Out of Food in the Last Year: Never true  Transportation Needs: No Transportation Needs (01/12/2024)   PRAPARE - Administrator, Civil Service (Medical): No    Lack of Transportation (Non-Medical): No  Physical Activity: Sufficiently Active (01/12/2024)   Exercise Vital Sign    Days of Exercise per Week: 7 days    Minutes of Exercise per Session: 40 min  Stress: No Stress Concern Present (01/12/2024)   [employer] of Occupational Health - Occupational Stress Questionnaire    Feeling of Stress: Not at all  Social Connections: Socially Integrated (01/12/2024)   Social Connection and Isolation Panel    Frequency of Communication with Friends and Family: More than three times a week    Frequency of Social Gatherings with Friends and Family:  Twice a week    Attends Religious Services: More than 4 times per year    Active Member of [employer] or Organizations: Yes    Attends Engineer, structural: More than 4 times per year    Marital Status: Married    Tobacco Counseling Counseling given: Not Answered    Clinical Intake:  Pre-visit preparation completed: Yes  Pain : No/denies pain     BMI - recorded: 21.27 Nutritional Status: BMI of 19-24  Normal Nutritional Risks: None Diabetes: No  No results found for: HGBA1C   How often do you need to have someone help you when you read instructions, pamphlets, or other written materials from your doctor or pharmacy?:  1 - Never  Interpreter Needed?: No  Information entered by :: R. Simisola Sandles LPN   Activities of Daily Living     01/12/2024    6:09 PM  In your present state of health, do you have any difficulty performing the following activities:  Hearing? 0  Vision? 0  Difficulty concentrating or making decisions? 0  Walking or climbing stairs? 0  Dressing or bathing? 0  Doing errands, shopping? 0  Preparing Food and eating ? N  Using the Toilet? N  In the past six months, have you accidently leaked urine? N  Do you have problems with loss of bowel control? N  Managing your Medications? N  Managing your Finances? N  Housekeeping or managing your Housekeeping? N    Patient Care Team: Marylynn Verneita CROME, MD as PCP - General (Internal Medicine)  I have updated your Care Teams any recent Medical Services you may have received from other providers in the past year.     Assessment:   This is a routine wellness examination for Avin.  Hearing/Vision screen Hearing Screening - Comments:: No issues Vision Screening - Comments:: glasses   Goals Addressed             This Visit's Progress    Patient Stated       Wants to continue to walk up and down the stairs daily until her legs ache       Depression Screen     01/13/2024    8:58 AM 11/21/2023    10:44 AM 12/26/2021   11:31 AM 10/26/2021   12:49 PM 09/15/2021    3:14 PM  PHQ 2/9 Scores  PHQ - 2 Score 0 0  0 0  PHQ- 9 Score 0   0   Exception Documentation   Other- indicate reason in comment box      Fall Risk     01/12/2024    6:09 PM 11/21/2023   10:44 AM 12/26/2021   11:15 AM 10/26/2021   12:48 PM 09/15/2021    3:14 PM  Fall Risk   Falls in the past year? 0 0 0 0 1  Number falls in past yr: 0 0  0 0  Injury with Fall? 0 0  0 1  Risk for fall due to : No Fall Risks No Fall Risks  No Fall Risks History of fall(s)  Follow up Falls evaluation completed;Falls prevention discussed Falls evaluation completed Falls evaluation completed  Falls evaluation completed  Falls evaluation completed      Data saved with a previous flowsheet row definition    MEDICARE RISK AT HOME:  Medicare Risk at Home Any stairs in or around the home?: (Patient-Rptd) Yes If so, are there any without handrails?: (Patient-Rptd) No Home free of loose throw rugs in walkways, pet beds, electrical cords, etc?: (Patient-Rptd) Yes Adequate lighting in your home to reduce risk of falls?: (Patient-Rptd) Yes Life alert?: (Patient-Rptd) No Use of a cane, walker or w/c?: (Patient-Rptd) No Grab bars in the bathroom?: (Patient-Rptd) No Shower chair or bench in shower?: (Patient-Rptd) No Elevated toilet seat or a handicapped toilet?: (Patient-Rptd) No  TIMED UP AND GO:  Was the test performed?  No  Cognitive Function: 6CIT completed        01/13/2024    9:01 AM  6CIT Screen  What Year? 0 points  What month? 0 points  What time? 0 points  Count back from 20 0 points  Months in reverse 0 points  Repeat phrase  0 points  Total Score 0 points    Immunizations Immunization History  Administered Date(s) Administered   Influenza, High Dose Seasonal PF 03/03/2019   Influenza-Unspecified 03/16/2020   PFIZER Comirnaty(Gray Top)Covid-19 Tri-Sucrose Vaccine 08/24/2019, 09/15/2019, 04/28/2020   Tdap 03/07/2018    Zoster Recombinant(Shingrix) 07/03/2017, 01/16/2018    Screening Tests Health Maintenance  Topic Date Due   DEXA SCAN  Never done   Medicare Annual Wellness (AWV)  12/27/2022   COVID-19 Vaccine (4 - 2024-25 season) 03/03/2023   INFLUENZA VACCINE  01/31/2024   DTaP/Tdap/Td (2 - Td or Tdap) 03/07/2028   Zoster Vaccines- Shingrix  Completed   Hepatitis B Vaccines  Aged Out   HPV VACCINES  Aged Out   Meningococcal B Vaccine  Aged Out   Pneumococcal Vaccine: 50+ Years  Discontinued   Hepatitis C Screening  Discontinued    Health Maintenance  Health Maintenance Due  Topic Date Due   DEXA SCAN  Never done   Medicare Annual Wellness (AWV)  12/27/2022   COVID-19 Vaccine (4 - 2024-25 season) 03/03/2023   Health Maintenance Items Addressed: Patient declines all vaccines. Patient was reminded to call and schedule Mammogram and Dexa that was ordered 11/21/23.  Additional Screening:  Vision Screening: Recommended annual ophthalmology exams for early detection of glaucoma and other disorders of the eye. Up to date   [facility] Would you like a referral to an eye doctor? No    Dental Screening: Recommended annual dental exams for proper oral hygiene  Community Resource Referral / Chronic Care Management: CRR required this visit?  No   CCM required this visit?  No   Plan:    I have personally reviewed and noted the following in the patient's chart:   Medical and social history Use of alcohol, tobacco or illicit drugs  Current medications and supplements including opioid prescriptions. Patient is not currently taking opioid prescriptions. Functional ability and status Nutritional status Physical activity Advanced directives List of other physicians Hospitalizations, surgeries, and ER visits in previous 12 months Vitals Screenings to include cognitive, depression, and falls Referrals and appointments  In addition, I have reviewed and discussed with patient certain  preventive protocols, quality metrics, and best practice recommendations. A written personalized care plan for preventive services as well as general preventive health recommendations were provided to patient.   Angeline Fredericks, LPN   2/85/7974   After Visit Summary: (MyChart) Due to this being a telephonic visit, the after visit summary with patients personalized plan was offered to patient via MyChart   Notes: Nothing significant to report at this time.

## 2024-01-13 NOTE — Patient Instructions (Signed)
 Teresa Hogan , Thank you for taking time out of your busy schedule to complete your Annual Wellness Visit with me. I enjoyed our conversation and look forward to speaking with you again next year. I, as well as your care team,  appreciate your ongoing commitment to your health goals. Please review the following plan we discussed and let me know if I can assist you in the future. Your Game plan/ To Do List    Referrals: If you haven't heard from the office you've been referred to, please reach out to them at the phone provided.  Remember to call and schedule your mammogram and dexa that was ordered 11/21/23. Follow up Visits: Next Medicare AWV with our clinical staff: 01/15/25 @ 8:50   Have you seen your provider in the last 6 months (3 months if uncontrolled diabetes)? Yes Next Office Visit with your provider:  Referred out, will schedule appointment after test are completed  Clinician Recommendations:  Aim for 30 minutes of exercise or brisk walking, 6-8 glasses of water, and 5 servings of fruits and vegetables each day.       This is a list of the screening recommended for you and due dates:  Health Maintenance  Topic Date Due   DEXA scan (bone density measurement)  Never done   COVID-19 Vaccine (4 - 2024-25 season) 03/03/2023   Mammogram  01/17/2024   Flu Shot  01/31/2024   Medicare Annual Wellness Visit  01/12/2025   DTaP/Tdap/Td vaccine (2 - Td or Tdap) 03/07/2028   Zoster (Shingles) Vaccine  Completed   Hepatitis B Vaccine  Aged Out   HPV Vaccine  Aged Out   Meningitis B Vaccine  Aged Out   Pneumococcal Vaccine for age over 17  Discontinued   Hepatitis C Screening  Discontinued    Advanced directives: (Copy Requested) Please bring a copy of your health care power of attorney and living will to the office to be added to your chart at your convenience. You can mail to [facility] [address] [place], [place] [address] or email to  [email or web address] Advance Care Planning is important because it:  [x]  Makes sure you receive the medical care that is consistent with your values, goals, and preferences  [x]  It provides guidance to your family and loved ones and reduces their decisional burden about whether or not they are making the right decisions based on your wishes.

## 2024-01-13 NOTE — Telephone Encounter (Signed)
 Performed AWV. FYI Patient wanted to thank Dr. Tullo for referring her out to Dr. Jolynn. Patient stated that she is scheduled for a biopsy in 3 weeks. Patient stated that Dr. Marylynn deserves a gold star for picking up on this and referring her out.

## 2024-02-04 ENCOUNTER — Encounter (HOSPITAL_COMMUNITY): Payer: Self-pay | Admitting: Obstetrics and Gynecology

## 2024-02-04 NOTE — Progress Notes (Signed)
 Spoke w/ via phone for pre-op interview--- Teresa Hogan needs dos---- CBC per surgeon.         Hogan results------ COVID test -----patient states asymptomatic no test needed Arrive at -------0915 NPO after MN NO Solid Food.  Clear liquids from MN until---0815 Pre-Surgery Ensure or G2:  Med rec completed Medications to take morning of surgery -----Wellbutrin and Zoloft. Diabetic medication -----  GLP1 agonist last dose: GLP1 instructions:  Patient instructed no nail polish to be worn day of surgery Patient instructed to bring photo id and insurance card day of surgery Patient aware to have Driver (ride ) / caregiver    for 24 hours after surgery - Teresa Hogan Needs Patient Special Instructions ----- Pre-Op special Instructions -----  Patient verbalized understanding of instructions that were given at this phone interview. Patient denies chest pain, sob, fever, cough at the interview.

## 2024-02-11 ENCOUNTER — Ambulatory Visit (HOSPITAL_COMMUNITY)
Admission: RE | Admit: 2024-02-11 | Discharge: 2024-02-11 | Disposition: A | Discharge status: Home / Self Care | Attending: Obstetrics and Gynecology | Admitting: Obstetrics and Gynecology

## 2024-02-11 ENCOUNTER — Other Ambulatory Visit: Payer: Self-pay

## 2024-02-11 ENCOUNTER — Encounter (HOSPITAL_COMMUNITY)
Admission: RE | Disposition: A | Discharge status: Home / Self Care | Payer: Self-pay | Source: Home / Self Care | Attending: Obstetrics and Gynecology

## 2024-02-11 ENCOUNTER — Ambulatory Visit (HOSPITAL_COMMUNITY): Admitting: Anesthesiology

## 2024-02-11 ENCOUNTER — Encounter (HOSPITAL_COMMUNITY): Payer: Self-pay | Admitting: Obstetrics and Gynecology

## 2024-02-11 DIAGNOSIS — N904 Leukoplakia of vulva: Secondary | ICD-10-CM | POA: Insufficient documentation

## 2024-02-11 DIAGNOSIS — N840 Polyp of corpus uteri: Secondary | ICD-10-CM | POA: Insufficient documentation

## 2024-02-11 DIAGNOSIS — R9389 Abnormal findings on diagnostic imaging of other specified body structures: Secondary | ICD-10-CM | POA: Diagnosis not present

## 2024-02-11 DIAGNOSIS — N9489 Other specified conditions associated with female genital organs and menstrual cycle: Secondary | ICD-10-CM | POA: Diagnosis not present

## 2024-02-11 DIAGNOSIS — N8003 Adenomyosis of the uterus: Secondary | ICD-10-CM | POA: Insufficient documentation

## 2024-02-11 HISTORY — PX: DILATATION & CURRETTAGE/HYSTEROSCOPY WITH RESECTOCOPE: SHX5572

## 2024-02-11 HISTORY — PX: MYOSURE RESECTION: SHX7611

## 2024-02-11 LAB — CBC
HCT: 37.6 % (ref 36.0–46.0)
Hemoglobin: 12.6 g/dL (ref 12.0–15.0)
MCH: 31.7 pg (ref 26.0–34.0)
MCHC: 33.5 g/dL (ref 30.0–36.0)
MCV: 94.7 fL (ref 80.0–100.0)
Platelets: 234 K/uL (ref 150–400)
RBC: 3.97 MIL/uL (ref 3.87–5.11)
RDW: 13 % (ref 11.5–15.5)
WBC: 5.6 K/uL (ref 4.0–10.5)
nRBC: 0 % (ref 0.0–0.2)

## 2024-02-11 SURGERY — DILATATION & CURETTAGE/HYSTEROSCOPY WITH RESECTOCOPE
Anesthesia: General | Site: Vagina

## 2024-02-11 MED ORDER — GLYCOPYRROLATE PF 0.2 MG/ML IJ SOSY
PREFILLED_SYRINGE | INTRAMUSCULAR | Status: DC | PRN
Start: 2024-02-11 — End: 2024-02-11
  Administered 2024-02-11 (×2): .2 mg via INTRAVENOUS

## 2024-02-11 MED ORDER — MIDAZOLAM HCL 2 MG/2ML IJ SOLN
INTRAMUSCULAR | Status: AC
Start: 2024-02-11 — End: 2024-02-11
  Filled 2024-02-11: qty 2

## 2024-02-11 MED ORDER — PROPOFOL 10 MG/ML IV BOLUS
INTRAVENOUS | Status: DC | PRN
  Administered 2024-02-11 (×2): 100 mg via INTRAVENOUS

## 2024-02-11 MED ORDER — LACTATED RINGERS IV SOLN: INTRAVENOUS | Status: DC

## 2024-02-11 MED ORDER — OXYCODONE HCL 5 MG PO TABS
5.0000 mg | ORAL_TABLET | Freq: Once | ORAL | Status: AC
  Administered 2024-02-11 (×2): 5 mg via ORAL

## 2024-02-11 MED ORDER — DROPERIDOL 2.5 MG/ML IJ SOLN: 0.6250 mg | Freq: Once | INTRAMUSCULAR | Status: DC | PRN

## 2024-02-11 MED ORDER — DEXAMETHASONE SODIUM PHOSPHATE 10 MG/ML IJ SOLN
INTRAMUSCULAR | Status: AC
  Filled 2024-02-11: qty 1

## 2024-02-11 MED ORDER — DEXAMETHASONE SODIUM PHOSPHATE 10 MG/ML IJ SOLN
INTRAMUSCULAR | Status: DC | PRN
  Administered 2024-02-11 (×2): 10 mg via INTRAVENOUS

## 2024-02-11 MED ORDER — CHLORHEXIDINE GLUCONATE 0.12 % MT SOLN
15.0000 mL | Freq: Once | OROMUCOSAL | Status: AC
  Administered 2024-02-11 (×2): 15 mL via OROMUCOSAL

## 2024-02-11 MED ORDER — ONDANSETRON HCL 4 MG/2ML IJ SOLN
INTRAMUSCULAR | Status: DC | PRN
  Administered 2024-02-11 (×2): 4 mg via INTRAVENOUS

## 2024-02-11 MED ORDER — OXYCODONE HCL 5 MG PO TABS
ORAL_TABLET | ORAL | Status: AC
  Filled 2024-02-11: qty 1

## 2024-02-11 MED ORDER — POVIDONE-IODINE 10 % EX SWAB: 2.0000 | Freq: Once | CUTANEOUS | Status: DC

## 2024-02-11 MED ORDER — LIDOCAINE 2% (20 MG/ML) 5 ML SYRINGE
INTRAMUSCULAR | Status: DC | PRN
  Administered 2024-02-11 (×2): 60 mg via INTRAVENOUS

## 2024-02-11 MED ORDER — FENTANYL CITRATE (PF) 250 MCG/5ML IJ SOLN
INTRAMUSCULAR | Status: DC | PRN
  Administered 2024-02-11 (×4): 25 ug via INTRAVENOUS

## 2024-02-11 MED ORDER — FENTANYL CITRATE (PF) 250 MCG/5ML IJ SOLN
INTRAMUSCULAR | Status: AC
  Filled 2024-02-11: qty 5

## 2024-02-11 MED ORDER — EPHEDRINE SULFATE-NACL 50-0.9 MG/10ML-% IV SOSY
PREFILLED_SYRINGE | INTRAVENOUS | Status: DC | PRN
  Administered 2024-02-11 (×2): 10 mg via INTRAVENOUS

## 2024-02-11 MED ORDER — ORAL CARE MOUTH RINSE: 15.0000 mL | Freq: Once | OROMUCOSAL | Status: AC

## 2024-02-11 MED ORDER — CHLORHEXIDINE GLUCONATE 0.12 % MT SOLN
OROMUCOSAL | Status: AC
  Filled 2024-02-11: qty 15

## 2024-02-11 MED ORDER — LIDOCAINE 2% (20 MG/ML) 5 ML SYRINGE
INTRAMUSCULAR | Status: AC
  Filled 2024-02-11: qty 5

## 2024-02-11 MED ORDER — FENTANYL CITRATE (PF) 100 MCG/2ML IJ SOLN
25.0000 ug | INTRAMUSCULAR | Status: DC | PRN
  Administered 2024-02-11 (×2): 25 ug via INTRAVENOUS

## 2024-02-11 MED ORDER — KETOROLAC TROMETHAMINE 30 MG/ML IJ SOLN
INTRAMUSCULAR | Status: DC | PRN
Start: 2024-02-11 — End: 2024-02-11
  Administered 2024-02-11 (×2): 30 mg via INTRAVENOUS

## 2024-02-11 MED ORDER — ONDANSETRON HCL 4 MG/2ML IJ SOLN
INTRAMUSCULAR | Status: AC
Start: 2024-02-11 — End: 2024-02-11
  Filled 2024-02-11: qty 2

## 2024-02-11 MED ORDER — EPHEDRINE 5 MG/ML INJ
INTRAVENOUS | Status: AC
  Filled 2024-02-11: qty 5

## 2024-02-11 MED ORDER — FENTANYL CITRATE (PF) 100 MCG/2ML IJ SOLN
INTRAMUSCULAR | Status: AC
Start: 2024-02-11 — End: 2024-02-11
  Filled 2024-02-11: qty 2

## 2024-02-11 MED ORDER — PROPOFOL 10 MG/ML IV BOLUS
INTRAVENOUS | Status: AC
  Filled 2024-02-11: qty 20

## 2024-02-11 MED ORDER — DEXMEDETOMIDINE HCL IN NACL 80 MCG/20ML IV SOLN
INTRAVENOUS | Status: DC | PRN
  Administered 2024-02-11 (×2): 8 ug via INTRAVENOUS

## 2024-02-11 MED ORDER — MISOPROSTOL 200 MCG PO TABS
200.0000 ug | ORAL_TABLET | Freq: Once | ORAL | Status: AC
  Administered 2024-02-11 (×2): 200 ug via VAGINAL
  Filled 2024-02-11: qty 1

## 2024-02-11 MED ORDER — GLYCOPYRROLATE PF 0.2 MG/ML IJ SOSY
PREFILLED_SYRINGE | INTRAMUSCULAR | Status: AC
Start: 2024-02-11 — End: 2024-02-11
  Filled 2024-02-11: qty 1

## 2024-02-11 MED ORDER — SODIUM CHLORIDE 0.9 % IR SOLN
Status: DC | PRN
  Administered 2024-02-11 (×2): 3000 mL

## 2024-02-11 SURGICAL SUPPLY — 16 items
CATH ROBINSON RED A/P 16FR (CATHETERS) ×1 IMPLANT
DEVICE MYOSURE LITE (MISCELLANEOUS) IMPLANT
DEVICE MYOSURE REACH (MISCELLANEOUS) IMPLANT
DILATOR CANAL MILEX (MISCELLANEOUS) IMPLANT
GLOVE ECLIPSE 6.5 STRL STRAW (GLOVE) ×1 IMPLANT
GLOVE SURG UNDER POLY LF SZ7 (GLOVE) ×1 IMPLANT
GOWN STRL REUS W/ TWL LRG LVL3 (GOWN DISPOSABLE) ×1 IMPLANT
KIT PROCEDURE FLUENT (KITS) ×1 IMPLANT
KIT TURNOVER KIT B (KITS) ×1 IMPLANT
PACK VAGINAL MINOR WOMEN LF (CUSTOM PROCEDURE TRAY) ×1 IMPLANT
SEAL CERVICAL OMNI LOK (ABLATOR) IMPLANT
SEAL ROD LENS SCOPE MYOSURE (ABLATOR) ×1 IMPLANT
SOL .9 NS 3000ML IRR UROMATIC (IV SOLUTION) IMPLANT
SYSTEM TISS REMOVAL MYOSURE XL (MISCELLANEOUS) IMPLANT
TOWEL GREEN STERILE FF (TOWEL DISPOSABLE) ×1 IMPLANT
UNDERPAD 30X36 HEAVY ABSORB (UNDERPADS AND DIAPERS) ×1 IMPLANT

## 2024-02-11 NOTE — H&P (Signed)
 Teresa Hogan is an 78 y.o. female. G0 WF presents for diagnostic hysteroscopy, hysteroscopic resection of endometrial polyp using myosure, dilation and curettage  Pertinent Gynecological History: Menses: post-menopausal Bleeding: n/a Contraception: none DES exposure: denies Blood transfusions: none Sexually transmitted diseases: no past history Previous GYN Procedures: n/a  Last mammogram: normal Date: 2024 Last pap: normal Date: 2024 OB History: G0, P0   Menstrual History: Menarche age: n/a No LMP recorded. Patient is postmenopausal.    Past Medical History:  Diagnosis Date   Cancer (HCC)    skin ca   Depression    Motion sickness    PONV (postoperative nausea and vomiting)    Nausea after breast biopsy many years ago    Past Surgical History:  Procedure Laterality Date   BREAST BIOPSY Left    neg   BREAST EXCISIONAL BIOPSY Left    CATARACT EXTRACTION W/PHACO Right 11/01/2022   Procedure: CATARACT EXTRACTION PHACO AND INTRAOCULAR LENS PLACEMENT (IOC) RIGHT  13.28  01:18.9;  Surgeon: Enola Feliciano Hugger, MD;  [facility];  Service: Ophthalmology;  Laterality: Right;   CATARACT EXTRACTION W/PHACO Left 12/20/2022   Procedure: CATARACT EXTRACTION PHACO AND INTRAOCULAR LENS PLACEMENT (IOC) LEFT  7.52  00:51.7;  Surgeon: Enola Feliciano Hugger, MD;  Location: [facility];  Service: Ophthalmology;  Laterality: Left;    Family History  Problem Relation Age of Onset   Breast cancer Mother 32   Non-Hodgkin's lymphoma Father    Prostate cancer Brother    Breast cancer Maternal Aunt 40   Breast cancer Maternal Grandmother        mat gm and mat great gm    Social History:  reports that she has never smoked. She has never used smokeless tobacco. She reports current alcohol use of about 7.0 standard drinks of alcohol per week. She reports that she does not use drugs.  Allergies:  Allergies  Allergen Reactions   Covid-19 (Adenovirus) Vaccine  Cough    Pfizer vaccine  and coughs    No medications prior to admission.    Review of Systems  All other systems reviewed and are negative.   Height 5' 9 (1.753 m), weight 65.8 kg. Physical Exam Constitutional:      Appearance: Normal appearance.  Eyes:     Extraocular Movements: Extraocular movements intact.  Cardiovascular:     Rate and Rhythm: Regular rhythm.     Heart sounds: Normal heart sounds.  Pulmonary:     Breath sounds: Normal breath sounds.  Abdominal:     Palpations: Abdomen is soft.  Genitourinary:    General: Normal vulva.     Comments: Vagina: atrophic pale mucosa Cervix: nulliparous Uterus nl AV Adnexa nl Musculoskeletal:        General: Normal range of motion.     Cervical back: Neck supple.  Skin:    General: Skin is warm and dry.  Neurological:     General: No focal deficit present.     Mental Status: She is alert and oriented to person, place, and time.     No results found for this or any previous visit (from the past 24 hours).  No results found.  Assessment/Plan: Endometrial polyp Endometrial thickening on sonogram P) dx hysteroscopy, D&C, hysteroscopic resection of endometrial polyp using myosure Procedure explained. Risk of surgery reviewed including infection, bleeding, injruy  Amoni Scallan A Abby Stines 02/11/2024, 7:56 AM

## 2024-02-11 NOTE — Anesthesia Postprocedure Evaluation (Signed)
 Anesthesia Post Note  Patient: Teresa Hogan  Procedure(s) Performed: DILATATION & CURETTAGE/HYSTEROSCOPY (Vagina ) MYOSURE RESECTION (Vagina )     Patient location during evaluation: PACU Anesthesia Type: General Level of consciousness: sedated and patient cooperative Pain management: pain level controlled Vital Signs Assessment: post-procedure vital signs reviewed and stable Respiratory status: spontaneous breathing Cardiovascular status: stable Anesthetic complications: no   No notable events documented.  Last Vitals:  Vitals:   02/11/24 1345 02/11/24 1400  BP: (!) 120/59 (!) 133/100  Pulse: 63 67  Resp: 14 12  Temp:  36.5 C  SpO2: 97% 96%    Last Pain:  Vitals:   02/11/24 1420  TempSrc:   PainSc: 2                  Norleen Pope

## 2024-02-11 NOTE — Discharge Instructions (Addendum)
CALL  IF TEMP>100.4, NOTHING PER VAGINA X 2 WK, CALL IF SOAKING A MAXI  PAD EVERY HOUR OR MORE FREQUENTLY   Post Anesthesia Home Care Instructions  Activity: Get plenty of rest for the remainder of the day. A responsible individual must stay with you for 24 hours following the procedure.  For the next 24 hours, DO NOT: -Drive a car -Paediatric nurse -Drink alcoholic beverages -Take any medication unless instructed by your physician -Make any legal decisions or sign important papers.  Meals: Start with liquid foods such as gelatin or soup. Progress to regular foods as tolerated. Avoid greasy, spicy, heavy foods. If nausea and/or vomiting occur, drink only clear liquids until the nausea and/or vomiting subsides. Call your physician if vomiting continues.  Special Instructions/Symptoms: Your throat may feel dry or sore from the anesthesia or the breathing tube placed in your throat during surgery. If this causes discomfort, gargle with warm salt water. The discomfort should disappear within 24 hours.

## 2024-02-11 NOTE — Brief Op Note (Signed)
 02/11/2024  12:08 PM  PATIENT:  Teresa Hogan Needs  78 y.o. female  PRE-OPERATIVE DIAGNOSIS:  endometrial mass endometrial thickening on sonogram  POST-OPERATIVE DIAGNOSIS:  endometrial mass, endometrial thickening on sonogram  PROCEDURE:  diagnostic hysteroscopy, Dilation and curettage, hysteroscopic resection of endometrial polyp  SURGEON:  Surgeons and Role:    * Rutherford Gain, MD - Primary  PHYSICIAN ASSISTANT:   ASSISTANTS: none   ANESTHESIA:   general Findings: polypoid lesion. Cystic endometrium EBL:  2 mL   BLOOD ADMINISTERED:none  DRAINS: none   LOCAL MEDICATIONS USED:  NONE  SPECIMEN:  Source of Specimen:  emc with polyp  DISPOSITION OF SPECIMEN:  PATHOLOGY  COUNTS:  YES  TOURNIQUET:  * No tourniquets in log *  DICTATION: .Other Dictation: Dictation Number [id number]  PLAN OF CARE: Discharge to home after PACU  PATIENT DISPOSITION:  PACU - hemodynamically stable.   Delay start of Pharmacological VTE agent (>24hrs) due to surgical blood loss or risk of bleeding: no

## 2024-02-11 NOTE — Anesthesia Procedure Notes (Signed)
 Procedure Name: LMA Insertion Date/Time: 02/11/2024 11:29 AM  Performed by: Jolynn Mage, CRNAPre-anesthesia Checklist: Patient identified, Emergency Drugs available, Suction available and Patient being monitored Patient Re-evaluated:Patient Re-evaluated prior to induction Oxygen Delivery Method: Circle system utilized Preoxygenation: Pre-oxygenation with 100% oxygen Induction Type: IV induction Ventilation: Mask ventilation without difficulty LMA: LMA flexible inserted LMA Size: 4.0 Number of attempts: 1 Placement Confirmation: positive ETCO2 and breath sounds checked- equal and bilateral Tube secured with: Tape Dental Injury: Teeth and Oropharynx as per pre-operative assessment

## 2024-02-11 NOTE — Interval H&P Note (Signed)
 History and Physical Interval Note:  02/11/2024 11:02 AM  Teresa Hogan  has presented today for surgery, with the diagnosis of endometrial mass endometrial thickening on sonogram.  The various methods of treatment have been discussed with the patient and family. After consideration of risks, benefits and other options for treatment, the patient has consented to  diagnostic hysteroscopy, hysteroscopic resection of endometrial polyp using myosure, dilation and  curettage as a surgical intervention.  The patient's history has been reviewed, patient examined, no change in status, stable for surgery.  I have reviewed the patient's chart and labs.  Questions were answered to the patient's satisfaction.     Teresa Hogan

## 2024-02-11 NOTE — Anesthesia Preprocedure Evaluation (Addendum)
 Anesthesia Evaluation  Patient identified by MRN, date of birth, ID band Patient awake    Reviewed: Allergy & Precautions, H&P , NPO status , Patient's Chart, lab work & pertinent test results  History of Anesthesia Complications (+) PONV and history of anesthetic complications  Airway Mallampati: II  TM Distance: >3 FB Neck ROM: Full    Dental no notable dental hx. (+) Dental Advisory Given, Caps   Pulmonary neg pulmonary ROS   Pulmonary exam normal breath sounds clear to auscultation       Cardiovascular negative cardio ROS Normal cardiovascular exam Rhythm:Regular Rate:Normal     Neuro/Psych  PSYCHIATRIC DISORDERS  Depression    negative neurological ROS     GI/Hepatic Neg liver ROS,GERD  Controlled,,  Endo/Other  negative endocrine ROS    Renal/GU Renal disease     Musculoskeletal negative musculoskeletal ROS (+)    Abdominal   Peds  Hematology negative hematology ROS (+)   Anesthesia Other Findings Depression  Cancer (HCC) Motion sickness  PONV (postoperative nausea and vomiting)  Previous cataract surgery May 2024  Reproductive/Obstetrics                              Anesthesia Physical Anesthesia Plan  ASA: 2  Anesthesia Plan: General   Post-op Pain Management: Tylenol PO (pre-op)* and Gabapentin PO (pre-op)*   Induction: Intravenous  PONV Risk Score and Plan: 4 or greater and Ondansetron , Dexamethasone  and Treatment may vary due to age or medical condition  Airway Management Planned: LMA  Additional Equipment:   Intra-op Plan:   Post-operative Plan: Extubation in OR  Informed Consent: I have reviewed the patients History and Physical, chart, labs and discussed the procedure including the risks, benefits and alternatives for the proposed anesthesia with the patient or authorized representative who has indicated his/her understanding and acceptance.     Dental  advisory given  Plan Discussed with: CRNA  Anesthesia Plan Comments:          Anesthesia Quick Evaluation

## 2024-02-11 NOTE — Transfer of Care (Signed)
 Immediate Anesthesia Transfer of Care Note  Patient: Teresa Hogan  Procedure(s) Performed: DILATATION & CURETTAGE/HYSTEROSCOPY (Vagina ) MYOSURE RESECTION (Vagina )  Patient Location: PACU  Anesthesia Type:General  Level of Consciousness: drowsy and patient cooperative  Airway & Oxygen Therapy: Patient Spontanous Breathing and Patient connected to face mask oxygen  Post-op Assessment: Report given to RN, Post -op Vital signs reviewed and stable, and Patient moving all extremities X 4  Post vital signs: Reviewed and stable  Last Vitals:  Vitals Value Taken Time  BP 110/67 02/11/24 12:06  Temp 36 C 02/11/24 12:06  Pulse 83 02/11/24 12:12  Resp 13 02/11/24 12:12  SpO2 98 % 02/11/24 12:12  Vitals shown include unfiled device data.  Last Pain:  Vitals:   02/11/24 0946  TempSrc: Oral         Complications: No notable events documented.

## 2024-02-12 ENCOUNTER — Encounter (HOSPITAL_COMMUNITY): Payer: Self-pay | Admitting: Obstetrics and Gynecology

## 2024-02-12 LAB — SURGICAL PATHOLOGY

## 2024-02-12 NOTE — Op Note (Signed)
 NAME: [name], Teresa Hogan MEDICAL RECORD NO: [id number] ACCOUNT NO: [id number] DATE OF BIRTH: Jan 05, 1946 FACILITY: [facility] LOCATION: [facility] PHYSICIAN: Netra Postlethwait A. Rutherford, MD  Operative Report   DATE OF PROCEDURE: 02/11/2024  PREOPERATIVE DIAGNOSES: 1.  Endometrial polyp. 2.  Endometrial thickening on ultrasound.  PROCEDURES PERFORMED: 1.  Diagnostic hysteroscopy. 2.  Hysteroscopic resection of endometrial polyp. 3.  Dilation and curettage.  POSTOPERATIVE DIAGNOSES: 1.  Endometrial polyp. 2.  Endometrial thickening on ultrasound.  ANESTHESIA:  General.  SURGEON:  Dickie Rutherford, MD.  ASSISTANT:  None.  DESCRIPTION OF PROCEDURE: Under adequate general anesthesia, the patient was placed in the dorsal lithotomy position.  The patient was sterilely prepped and draped in the usual fashion.  The bladder was not catheterized as the patient had voided prior to entering the room.  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  Examination under anesthesia revealed an anteverted, small uterus.  A bivalve speculum was placed in the vagina.  A single-tooth tenaculum was placed on the  anterior lip of the cervix.  The cervix easily accepted the os finder, and the cervix was then serially dilated up to a #19 Pratt dilator.  A diagnostic hysteroscope was introduced into the uterine cavity.  A left lateral polypoid lesion was noted.   Multiple cystic areas of the endometrium were noted throughout, particularly in the lower uterine segment.  Using the Reach resectoscope, the polyp was removed.  The cystic areas on the endometrium were also attempted to be resected, but due to the  atrophy, it was somewhat limited.  Therefore, the resectoscope was removed.  The cavity was then gently curetted circumferentially.  The hysteroscope was then reinserted, showing evidence of uniform removal of the cystic areas.  The endocervical canal  was  inspected.  No lesions were noted.  The procedure was felt to be complete.  All instruments were then removed from the vagina.  SPECIMEN:  Labelled endometrial curettings with polyp were sent to pathology.  ESTIMATED BLOOD LOSS:  2 mL.  FLUID DEFICIT:  45 mL.  COMPLICATIONS:  None.  DISPOSITION:  The patient tolerated the procedure well.  The patient was transferred to the recovery room in stable condition.    MUK D: 02/11/2024 12:29:40 pm T: 02/12/2024 1:14:00 am  JOB: [id number]/ [id number]

## 2024-03-05 ENCOUNTER — Ambulatory Visit (INDEPENDENT_AMBULATORY_CARE_PROVIDER_SITE_OTHER)

## 2024-03-05 ENCOUNTER — Ambulatory Visit
Admission: EM | Admit: 2024-03-05 | Discharge: 2024-03-05 | Disposition: A | Discharge status: Home / Self Care | Attending: Emergency Medicine | Admitting: Emergency Medicine

## 2024-03-05 DIAGNOSIS — R918 Other nonspecific abnormal finding of lung field: Secondary | ICD-10-CM | POA: Diagnosis not present

## 2024-03-05 DIAGNOSIS — R059 Cough, unspecified: Secondary | ICD-10-CM | POA: Diagnosis not present

## 2024-03-05 DIAGNOSIS — J440 Chronic obstructive pulmonary disease with acute lower respiratory infection: Secondary | ICD-10-CM

## 2024-03-05 DIAGNOSIS — J209 Acute bronchitis, unspecified: Secondary | ICD-10-CM

## 2024-03-05 DIAGNOSIS — R051 Acute cough: Secondary | ICD-10-CM

## 2024-03-05 MED ORDER — BENZONATATE 100 MG PO CAPS: 100.0000 mg | ORAL_CAPSULE | Freq: Three times a day (TID) | ORAL | 0 refills | Status: AC | PRN

## 2024-03-05 MED ORDER — AMOXICILLIN-POT CLAVULANATE 875-125 MG PO TABS: 1.0000 | ORAL_TABLET | Freq: Two times a day (BID) | ORAL | 0 refills | Status: DC

## 2024-03-05 NOTE — ED Triage Notes (Signed)
 Cough, congestion x 1 month. Tried mucinex, sudafed with no relief of symptoms.

## 2024-03-05 NOTE — ED Provider Notes (Signed)
 [facility]    CSN: [id number] Arrival date & time: 03/05/24  0857      History   Chief Complaint Chief Complaint  Patient presents with   Cough    HPI Teresa Hogan is a 78 y.o. female.   78 year old female, Teresa Hogan, presents to urgent care for cough and congestion for months.  Patient tried Mucinex and sudafed without relief of symptoms  PMH: Cancer, depression,motion sickness  The history is provided by the patient. No language interpreter was used.    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) 03/05/2024   Acute cough 03/05/2024   Encounter for preventive health examination 11/25/2023   Endometrial hyperplasia 11/21/2023   Abnormal serum protein test 12/12/2021   Seasonal allergic rhinitis 09/17/2021   Major depressive disorder, recurrent episode (HCC) 09/17/2021   CKD (chronic kidney disease) stage 3, GFR 30-59 ml/min (HCC) 09/17/2021   Fatty liver 09/17/2021   Osteopenia 09/17/2021   Essential tremor 11/09/2019   Low vitamin D level 05/12/2019    Past Surgical History:  Procedure Laterality Date   BREAST BIOPSY Left    neg   BREAST EXCISIONAL BIOPSY Left    CATARACT EXTRACTION W/PHACO Right 11/01/2022   Procedure: CATARACT EXTRACTION PHACO AND INTRAOCULAR LENS PLACEMENT (IOC) RIGHT  13.28  01:18.9;  Surgeon: Enola Feliciano Hugger, MD;  Location: [facility];  Service: Ophthalmology;  Laterality: Right;   CATARACT EXTRACTION W/PHACO Left 12/20/2022   Procedure: CATARACT EXTRACTION PHACO AND INTRAOCULAR LENS PLACEMENT (IOC) LEFT  7.52  00:51.7;  Surgeon: Enola Feliciano Hugger, MD;  Location: [facility];  Service: Ophthalmology;  Laterality: Left;    DILATATION & CURRETTAGE/HYSTEROSCOPY WITH RESECTOCOPE N/A 02/11/2024   Procedure: DILATATION & CURETTAGE/HYSTEROSCOPY;  Surgeon: Rutherford Gain, MD;  Location: [facility] OR;  Service: Gynecology;  Laterality: N/A;   MYOSURE RESECTION N/A 02/11/2024   Procedure: MELINDA RESECTION;  Surgeon: Rutherford Gain, MD;  Location: [facility] [place] [facility];  Service: Gynecology;  Laterality: N/A;    OB History   No obstetric history on file.      Home Medications    Prior to Admission medications   Medication Sig Start Date End Date Taking? Authorizing Provider  Acetylcysteine 600 MG CAPS Take by mouth.   Yes [provider]  amoxicillin -clavulanate (AUGMENTIN ) 875-125 MG tablet Take 1 tablet by mouth 2 (two) times daily for 5 days. 03/05/24 03/10/24 Yes Denette Hass, Rilla, NP  ascorbic acid (VITAMIN C) 500 MG tablet Take by mouth.   Yes [provider]  benzonatate  (TESSALON ) 100 MG capsule Take 1 capsule (100 mg total) by mouth 3 (three) times daily as needed for up to 3 days for cough. 03/05/24 03/08/24 Yes Hall Birchard, NP  buPROPion (WELLBUTRIN SR) 200 MG 12 hr tablet Take by mouth. 09/02/19  Yes [provider]  Cholecalciferol 50 MCG (2000 UT) CAPS Take 3 capsules daily for 3 months, then reduce to 1 capsule daily thereafter for Vitamin D Deficiency. 05/12/19  Yes [provider]  melatonin 5 MG TABS Take 5 mg by mouth as needed.   Yes [provider]  Multiple Vitamin (MULTI-VITAMIN) tablet Take 1 tablet by mouth daily.  Yes [provider]  Quercetin 500 MG CAPS Take 1 capsule by mouth daily.   Yes [provider]  QUETIAPINE FUMARATE PO Take 6.2 mg by mouth daily at 2 am.   Yes [provider]  sertraline (ZOLOFT) 50 MG tablet Take 50 mg by mouth daily. Take 1 and 1/2 tablet daily to equal 75 mg   Yes [provider]  cetirizine (ZYRTEC) 10 MG tablet Take by mouth.    [provider]  fluticasone (FLONASE) 50 MCG/ACT  nasal spray Place into the nose.    [provider]  loperamide (IMODIUM) 2 MG capsule Take by mouth. Patient taking differently: Take by mouth as needed.    [provider]  Probiotic Product (PROBIOTIC PO) Take 1 capsule by mouth daily. Patient not taking: Reported on 01/13/2024    [provider]    Family History Family History  Problem Relation Age of Onset   Breast cancer Mother 25   Non-Hodgkin's lymphoma Father    Prostate cancer Brother    Breast cancer Maternal Aunt 40   Breast cancer Maternal Grandmother        mat gm and mat great gm    Social History Social History   Tobacco Use   Smoking status: Never   Smokeless tobacco: Never  Vaping Use   Vaping status: Never Used  Substance Use Topics   Alcohol use: Yes    Alcohol/week: 7.0 standard drinks of alcohol    Types: 7 Cans of beer per week    Comment: 1 craft beer daily   Drug use: No     Allergies   Covid-19 (adenovirus) vaccine   Review of Systems Review of Systems  Constitutional:  Negative for fever.  HENT:  Positive for congestion.   Respiratory:  Positive for cough. Negative for wheezing.   Cardiovascular:  Negative for chest pain and palpitations.  Gastrointestinal:  Negative for nausea and vomiting.  All other systems reviewed and are negative.    Physical Exam Triage Vital Signs ED Triage Vitals  Encounter Vitals Group     BP 03/05/24 0954 113/63     Girls Systolic BP Percentile --      Girls Diastolic BP Percentile --      Boys Systolic BP Percentile --      Boys Diastolic BP Percentile --      Pulse Rate 03/05/24 0954 84     Resp 03/05/24 0954 18     Temp 03/05/24 0954 98.1 F (36.7 C)     Temp Source 03/05/24 0954 Oral     SpO2 03/05/24 0954 94 %     Weight --      Height --      Head Circumference --      Peak Flow --      Pain Score 03/05/24 0947 0     Pain Loc --      Pain Education --      Exclude from Growth Chart --    No data  found.  Updated Vital Signs BP 113/63 (BP Location: Left Arm)   Pulse 84   Temp 98.1 F (36.7 C) (Oral)   Resp 18   SpO2 94%   Visual Acuity Right Eye Distance:   Left Eye Distance:   Bilateral Distance:    Right Eye Near:   Left Eye Near:    Bilateral Near:     Physical Exam Vitals and nursing note reviewed.  Constitutional:  General: She is awake.     Appearance: She is well-developed and well-groomed.  HENT:     Head: Normocephalic.     Right Ear: Tympanic membrane is retracted.     Left Ear: Tympanic membrane is retracted.     Nose: Nose normal.     Mouth/Throat:     Lips: Pink.     Mouth: Mucous membranes are moist.     Pharynx: Oropharynx is clear. Uvula midline.  Cardiovascular:     Rate and Rhythm: Normal rate and regular rhythm.     Heart sounds: Normal heart sounds.  Pulmonary:     Effort: Pulmonary effort is normal.     Breath sounds: Normal breath sounds and air entry.  Neurological:     General: No focal deficit present.     Mental Status: She is alert and oriented to person, place, and time.     GCS: GCS eye subscore is 4. GCS verbal subscore is 5. GCS motor subscore is 6.  Psychiatric:        Attention and Perception: Attention normal.        Mood and Affect: Mood normal.        Speech: Speech normal.        Behavior: Behavior normal. Behavior is cooperative.      UC Treatments / Results  Labs (all labs ordered are listed, but only abnormal results are displayed) Labs Reviewed - No data to display  EKG   Radiology DG Chest 2 View Result Date: 03/05/2024 CLINICAL DATA:  Cough for 1 month. EXAM: CHEST - 2 VIEW COMPARISON:  06/21/2022 FINDINGS: The heart size and mediastinal contours are within normal limits. Stable pulmonary hyperinflation, suspicious for COPD. Both lungs are clear. The visualized skeletal structures are unremarkable. IMPRESSION: Probable COPD. No active cardiopulmonary disease. Electronically Signed   By: Norleen DELENA Kil  M.D.   On: 03/05/2024 11:02    Procedures Procedures (including critical care time)  Medications Ordered in UC Medications - No data to display  Initial Impression / Assessment and Plan / UC Course  I have reviewed the triage vital signs and the nursing notes.  Pertinent labs & imaging results that were available during my care of the patient were reviewed by me and considered in my medical decision making (see chart for details).  Clinical Course as of 03/05/24 1220  Thu Mar 05, 2024  1028 Cxr ordered for cough x 1 month, sat 94% on RA. [JD]    Clinical Course User Index [JD] Mae Denunzio, Rilla, NP   Discussed exam findings and plan of care with patient, Augmentin  and Tessalon  prescription given for bronchitis.  Strict ER precautions given, patient verbalized understanding to this provider.  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 We are treating you for bronchitis, take Augmentin  and tessalon  as directed. Follow up with your PCP. Return as needed.      ED Prescriptions     Medication Sig Dispense Auth. Provider   benzonatate  (TESSALON ) 100 MG capsule Take 1 capsule (100 mg total) by mouth 3 (three) times daily as needed for up to 3 days for cough. 10 capsule Zakara Parkey, NP   amoxicillin -clavulanate (AUGMENTIN ) 875-125 MG tablet Take 1 tablet by mouth 2 (two) times daily for 5 days.  10 tablet Tashema Tiller, Rilla, NP      PDMP not reviewed this encounter.   Aminta Rilla, NP 03/05/24 1220

## 2024-03-05 NOTE — Discharge Instructions (Addendum)
 Your xray was negative for pneumonia. We are treating you for bronchitis, take Augmentin  and tessalon  as directed. Follow up with your PCP. Return as needed.

## 2024-03-09 ENCOUNTER — Ambulatory Visit: Payer: Self-pay

## 2024-03-09 NOTE — Progress Notes (Unsigned)
    Subjective:    Patient ID: Teresa Hogan, female    DOB: 03-02-1946, 78 y.o.   MRN: [id number]      HPI Teresa Hogan is here for No chief complaint on file.   9/4 - UC for cough and congestion for months.  Sudafed and mucinex not helpful.  CXR showed COPD, no acute infection. Dx with bronchitis - rx'd augmentin , tessalon  perles.   She developed diarrhea on the abx.       Medications and allergies reviewed with patient and updated if appropriate.  Current Outpatient Medications on File Prior to Visit  Medication Sig Dispense Refill   Acetylcysteine 600 MG CAPS Take by mouth.     amoxicillin -clavulanate (AUGMENTIN ) 875-125 MG tablet Take 1 tablet by mouth 2 (two) times daily for 5 days. 10 tablet 0   ascorbic acid (VITAMIN C) 500 MG tablet Take by mouth.     buPROPion (WELLBUTRIN SR) 200 MG 12 hr tablet Take by mouth.     cetirizine (ZYRTEC) 10 MG tablet Take by mouth.     Cholecalciferol 50 MCG (2000 UT) CAPS Take 3 capsules daily for 3 months, then reduce to 1 capsule daily thereafter for Vitamin D Deficiency.     fluticasone (FLONASE) 50 MCG/ACT nasal spray Place into the nose.     loperamide (IMODIUM) 2 MG capsule Take by mouth. (Patient taking differently: Take by mouth as needed.)     melatonin 5 MG TABS Take 5 mg by mouth as needed.     Multiple Vitamin (MULTI-VITAMIN) tablet Take 1 tablet by mouth daily.     Probiotic Product (PROBIOTIC PO) Take 1 capsule by mouth daily. (Patient not taking: Reported on 01/13/2024)     Quercetin 500 MG CAPS Take 1 capsule by mouth daily.     QUETIAPINE FUMARATE PO Take 6.2 mg by mouth daily at 2 am.     sertraline (ZOLOFT) 50 MG tablet Take 50 mg by mouth daily. Take 1 and 1/2 tablet daily to equal 75 mg     No current facility-administered medications on file prior to visit.    Review of Systems     Objective:  There were no vitals filed for this visit. BP Readings from Last 3 Encounters:  03/05/24 113/63  02/11/24 (!)  133/100  11/21/23 108/66   Wt Readings from Last 3 Encounters:  02/11/24 145 lb (65.8 kg)  01/13/24 144 lb (65.3 kg)  11/21/23 144 lb 3.2 oz (65.4 kg)   There is no height or weight on file to calculate BMI.    Physical Exam         Assessment & Plan:    See Problem List for Assessment and Plan of chronic medical problems.

## 2024-03-09 NOTE — Telephone Encounter (Signed)
 LMTCB. Pt will need to be re-evaluated. Please schedule pt for an appt or advise her to be evaluated at an UC.

## 2024-03-09 NOTE — Telephone Encounter (Signed)
 FYI Only or Action Required?: Action required by provider: update on patient condition.  Patient was last seen in primary care on 11/21/2023 by Teresa Verneita CROME, MD.  Called Nurse Triage reporting Cough and Diarrhea.  Symptoms began several days ago.  Interventions attempted: OTC medications: loperamide, amoxicillin , codeine containing cough medicine, albuterol  mdi.  Symptoms are: gradually improving.  Triage Disposition: See Physician Within 24 Hours  Patient/caregiver understands and will follow disposition?: Yes  Summary: Persistant cough not going aware & diarrhea caused by amoxicillin .   Patient was seen at urgent care on Thursday for a persistent cough. She was prescribed amoxicillin , and it made the cough better but it isn't gone completley. She also stated that the antibiotic has given her diarrhea and she would like to know if a new medication could be sent in or if an appointment is needed.         Reason for Disposition  [1] Known COPD or other severe lung disease (i.e., bronchiectasis, cystic fibrosis, lung surgery) AND [2] symptoms getting worse (i.e., increased sputum purulence or amount, increased breathing difficulty  MODERATE diarrhea (e.g., 4-6 times / day more than normal)  Answer Assessment - Initial Assessment Questions Patient taking loperamide for diarrhea Cough improved, but still present   1. ANTIBIOTIC: What antibiotic are you taking? How many times per day?     amoxicillin  2. ANTIBIOTIC ONSET: When was the antibiotic started?     Thursday, five days ago 3. DIARRHEA SEVERITY: How bad is the diarrhea? How many more stools have you had in the past 24 hours than normal?      Many many many times per day 4. ONSET: When did the diarrhea begin?      One day after diarrhea started 5. BM CONSISTENCY: How loose or watery is the diarrhea?      watery 6. VOMITING: Are you also vomiting? If Yes, ask: How many times in the past 24 hours?       denies 7. ABDOMEN PAIN: Are you having any abdomen pain? If Yes, ask: What does it feel like? (e.g., crampy, dull, intermittent, constant)      denies 8. ABDOMEN PAIN SEVERITY: If present, ask: How bad is the pain?  (e.g., Scale 1-10; mild, moderate, or severe)     N/A 9. ORAL INTAKE: If vomiting, Have you been able to drink liquids? How much liquids have you had in the past 24 hours?     Maintaining intake 10. HYDRATION: Any signs of dehydration? (e.g., dry mouth [not just dry lips], too weak to stand, dizziness, new weight loss) When did you last urinate?       Patient staying hydrated 11. EXPOSURE: Have you traveled to a foreign country recently? Have you been exposed to anyone with diarrhea? Could you have eaten any food that was spoiled?       N/A 12. OTHER SYMPTOMS: Do you have any other symptoms? (e.g., fever, blood in stool)       denies 13. PREGNANCY: Is there any chance you are pregnant? When was your last menstrual period?       N/A  Answer Assessment - Initial Assessment Questions 1. ONSET: When did the cough begin?      A week ago 2. SEVERITY: How bad is the cough today?      improving 3. SPUTUM: Describe the color of your sputum (e.g., none, dry cough; clear, white, yellow, green)     yellow 4. HEMOPTYSIS: Are you coughing up any blood? If  Yes, ask: How much? (e.g., flecks, streaks, tablespoons, etc.)     denies 5. DIFFICULTY BREATHING: Are you having difficulty breathing? If Yes, ask: How bad is it? (e.g., mild, moderate, severe)      At times, using albuterol  inhaler 6. FEVER: Do you have a fever? If Yes, ask: What is your temperature, how was it measured, and when did it start?     denies 7. CARDIAC HISTORY: Do you have any history of heart disease? (e.g., heart attack, congestive heart failure)      denies 8. LUNG HISTORY: Do you have any history of lung disease?  (e.g., pulmonary embolus, asthma, emphysema)    COPD,  Seasonal allergies, no asthma 9. PE RISK FACTORS: Do you have a history of blood clots? (or: recent major surgery, recent prolonged travel, bedridden)     N/A 10. OTHER SYMPTOMS: Do you have any other symptoms? (e.g., runny nose, wheezing, chest pain)       wheezing 11. PREGNANCY: Is there any chance you are pregnant? When was your last menstrual period?       N/A 12. TRAVEL: Have you traveled out of the country in the last month? (e.g., travel history, exposures)       denies  Protocols used: Diarrhea on Antibiotics-A-AH, Cough - Acute Productive-A-AH

## 2024-03-10 ENCOUNTER — Encounter: Payer: Self-pay | Admitting: Internal Medicine

## 2024-03-10 ENCOUNTER — Ambulatory Visit (INDEPENDENT_AMBULATORY_CARE_PROVIDER_SITE_OTHER): Admitting: Internal Medicine

## 2024-03-10 VITALS — BP 112/80 | HR 73 | Temp 98.0°F | Ht 69.0 in | Wt 144.0 lb

## 2024-03-10 DIAGNOSIS — T3695XA Adverse effect of unspecified systemic antibiotic, initial encounter: Secondary | ICD-10-CM | POA: Diagnosis not present

## 2024-03-10 DIAGNOSIS — K521 Toxic gastroenteritis and colitis: Secondary | ICD-10-CM

## 2024-03-10 DIAGNOSIS — J40 Bronchitis, not specified as acute or chronic: Secondary | ICD-10-CM | POA: Diagnosis not present

## 2024-03-10 DIAGNOSIS — R062 Wheezing: Secondary | ICD-10-CM | POA: Diagnosis not present

## 2024-03-10 MED ORDER — ALBUTEROL SULFATE HFA 108 (90 BASE) MCG/ACT IN AERS: 2.0000 | INHALATION_SPRAY | Freq: Four times a day (QID) | RESPIRATORY_TRACT | 5 refills | Status: AC | PRN

## 2024-03-10 MED ORDER — PREDNISONE 20 MG PO TABS: 40.0000 mg | ORAL_TABLET | Freq: Every day | ORAL | 0 refills | Status: AC

## 2024-03-10 NOTE — Patient Instructions (Addendum)
      Medications changes include :   prednisone  40 mg daily x 5 days - take in the morning with food.       Return if symptoms worsen or fail to improve.

## 2024-03-10 NOTE — Telephone Encounter (Signed)
 Pt was seen today.

## 2024-04-24 DIAGNOSIS — H15001 Unspecified scleritis, right eye: Secondary | ICD-10-CM | POA: Diagnosis not present

## 2024-04-28 DIAGNOSIS — H15001 Unspecified scleritis, right eye: Secondary | ICD-10-CM | POA: Diagnosis not present

## 2024-05-08 DIAGNOSIS — H15001 Unspecified scleritis, right eye: Secondary | ICD-10-CM | POA: Diagnosis not present

## 2024-05-20 NOTE — Telephone Encounter (Signed)
 open in error

## 2024-05-26 DIAGNOSIS — H15001 Unspecified scleritis, right eye: Secondary | ICD-10-CM | POA: Diagnosis not present

## 2024-06-09 ENCOUNTER — Ambulatory Visit: Attending: Internal Medicine

## 2024-06-09 ENCOUNTER — Ambulatory Visit

## 2024-06-17 ENCOUNTER — Telehealth: Payer: Self-pay | Admitting: Internal Medicine

## 2024-06-17 NOTE — Telephone Encounter (Signed)
 Copied from CRM #[id number]. Topic: General - Other >> Jun 17, 2024  9:36 AM Vena HERO wrote: Reason for CRM: Chyrl from [employer] called in today stating that pt has recently applied for cms quality measure after having a bone fracture and he needed information to determine if she qualified. He wanted to know if she attended her bone scan appt on 12/09 (no show) and if she has any advanced illness or frailty. He can be reached at [phone] ext [phone] and he is also sending over a fax for Dr Marylynn to fill to help determine pt need.

## 2024-06-23 NOTE — Telephone Encounter (Signed)
 LMTCB

## 2024-06-26 NOTE — Telephone Encounter (Unsigned)
 Copied from CRM #[id number]. Topic: General - Other >> Jun 26, 2024  1:37 PM Macario HERO wrote: Reason for CRM: Patient returning call from [place]. Called [employer] and was advised gone for the day and to send a CRM to return call.

## 2024-06-30 NOTE — Telephone Encounter (Signed)
 LMTCB

## 2024-07-07 NOTE — Telephone Encounter (Unsigned)
 Copied from CRM #[id number]. Topic: General - Other >> Jun 26, 2024  1:37 PM Macario HERO wrote: Reason for CRM: Patient returning call from [place]. Called [employer] and was advised gone for the day and to send a CRM to return call. >> Jul 07, 2024 10:30 AM Drema MATSU wrote: Patient is returning a call from [place]. Please call pt.

## 2024-07-07 NOTE — Telephone Encounter (Signed)
 FYI  Pt has advised that she has spoken with her insurance company several times to let them know that she is not going to have the bone density scan done and will not take any of the medication recommended for osteoporosis.

## 2025-01-15 ENCOUNTER — Ambulatory Visit
